# Patient Record
Sex: Female | Born: 1950 | Race: White | Hispanic: No | Smoking: Never smoker
Health system: Southern US, Community
[De-identification: ages and names within clinical notes are randomized; demographics above are authoritative.]

## PROBLEM LIST (undated history)

## (undated) DIAGNOSIS — Z9889 Other specified postprocedural states: Secondary | ICD-10-CM

## (undated) DIAGNOSIS — Z8719 Personal history of other diseases of the digestive system: Secondary | ICD-10-CM

## (undated) DIAGNOSIS — D649 Anemia, unspecified: Secondary | ICD-10-CM

## (undated) DIAGNOSIS — R112 Nausea with vomiting, unspecified: Secondary | ICD-10-CM

## (undated) HISTORY — DX: Anemia, unspecified: D64.9

## (undated) HISTORY — PX: DILATION AND CURETTAGE OF UTERUS: SHX78

## (undated) HISTORY — PX: CHOLECYSTECTOMY: SHX55

## (undated) HISTORY — PX: KNEE ARTHROSCOPY: SUR90

---

## 2013-05-15 ENCOUNTER — Encounter (INDEPENDENT_AMBULATORY_CARE_PROVIDER_SITE_OTHER): Payer: Self-pay | Admitting: Surgery

## 2013-05-25 ENCOUNTER — Encounter (INDEPENDENT_AMBULATORY_CARE_PROVIDER_SITE_OTHER): Payer: Self-pay | Admitting: Surgery

## 2013-05-25 ENCOUNTER — Ambulatory Visit (INDEPENDENT_AMBULATORY_CARE_PROVIDER_SITE_OTHER): Payer: BC Managed Care – PPO | Admitting: Surgery

## 2013-05-25 VITALS — BP 128/72 | HR 72 | Temp 99.7°F | Resp 16 | Ht 64.0 in | Wt 173.6 lb

## 2013-05-25 DIAGNOSIS — K8010 Calculus of gallbladder with chronic cholecystitis without obstruction: Secondary | ICD-10-CM

## 2013-05-25 NOTE — Progress Notes (Signed)
[name], female   DOB: Dec 14, 1950, 62 y.o.   MRN: [id number]  Chief Complaint  Patient presents with  . New Evaluation    eval sx gall stones    HPI Felicia Nguyen is a 62 y.o. female.  Referred by Dr. Beverley Fiedler for evaluation of gallbladder disease HPI This is a 62 year old female who presents after a recent emergency department visit with gallbladder disease. Earlier in the year she reports that she was on a cruise when she began having diarrhea and some mild bloating. The diarrhea lasted almost 6 weeks. She tried to self medicate with some probiotics and this seemed to help somewhat. However recently she began having more diarrhea. Recently she was on vacation and had severe epigastric and right upper quadrant pain radiating through to her back. She was brought to the emergency department in [place] where she was ruled out for cardiac event. Ultrasound reportedly showed gallstones. Her transaminases were elevated with Alkaline phosphatase 118, AST 92, ALT 44, total bilirubin 0.6, Heacox count 7.6. Recent labs from her doctor's office showed all normal liver function tests. She continues to have some mild discomfort in her right upper quadrant. He presents now to discuss surgical evaluation. She has been referred to a gastroenterologist and has an appointment in 2 days. She has never had a colonoscopy. Past Medical History  Diagnosis Date  . Anemia     Past Surgical History  Procedure Laterality Date  . Knee arthroscopy  [id number]    right knee    Family History  Problem Relation Age of Onset  . COPD Mother   . Hypertension Mother   . Cancer Father   . Cancer Sister     Social History History  Substance Use Topics  . Smoking status: Never Smoker   . Smokeless tobacco: Never Used  . Alcohol Use: Yes    No Known Allergies  Current Outpatient Prescriptions  Medication Sig Dispense Refill  . ibuprofen (ADVIL,MOTRIN) 200 MG tablet Take 200 mg  by mouth as needed for pain.      . Oxycodone-Acetaminophen (PERCOCET PO) Take by mouth.      . promethazine (PHENERGAN) 25 MG tablet Take 25 mg by mouth every 6 (six) hours as needed for nausea.       No current facility-administered medications for this visit.    Review of Systems Review of Systems  Constitutional: Negative for fever, chills and unexpected weight change.  HENT: Negative for hearing loss, congestion, sore throat, trouble swallowing and voice change.   Eyes: Negative for visual disturbance.  Respiratory: Negative for cough and wheezing.   Cardiovascular: Negative for chest pain, palpitations and leg swelling.  Gastrointestinal: Positive for nausea, vomiting, abdominal pain, diarrhea and abdominal distention. Negative for constipation, blood in stool and anal bleeding.  Genitourinary: Negative for hematuria, vaginal bleeding and difficulty urinating.  Musculoskeletal: Negative for arthralgias.  Skin: Negative for rash and wound.  Neurological: Negative for seizures, syncope and headaches.  Hematological: Negative for adenopathy. Does not bruise/bleed easily.  Psychiatric/Behavioral: Negative for confusion.    Blood pressure 128/72, pulse 72, temperature 99.7 F (37.6 C), temperature source Temporal, resp. rate 16, height 5\' 4"  (1.626 m), weight 173 lb 9.6 oz (78.744 kg).  Physical Exam Physical Exam WDWN in NAD HEENT:  EOMI, sclera anicteric Neck:  No masses, no thyromegaly Lungs:  CTA bilaterally; normal respiratory effort CV:  Regular rate and rhythm; no murmurs Abd:  +bowel sounds, mildly distended; tender in RUQ;  no masses Ext:  Well-perfused; no edema Skin:  Warm, dry; no sign of jaundice  Data Reviewed Labs from [facility] No U/S report - just a single image showing a shadowing gallstone  Assessment    Chronic calculus cholecystitis     Plan    Laparoscopic cholecystectomy with intraoperative cholangiogram. The surgical procedure has been  discussed with the patient.  Potential risks, benefits, alternative treatments, and expected outcomes have been explained.  All of the patient's questions at this time have been answered.  The likelihood of reaching the patient's treatment goal is good.  The patient understand the proposed surgical procedure and wishes to proceed.         Lyliana Dicenso K. 05/25/2013, 10:58 AM

## 2013-06-08 ENCOUNTER — Other Ambulatory Visit (INDEPENDENT_AMBULATORY_CARE_PROVIDER_SITE_OTHER): Payer: Self-pay | Admitting: Surgery

## 2013-06-08 DIAGNOSIS — K824 Cholesterolosis of gallbladder: Secondary | ICD-10-CM

## 2013-06-08 DIAGNOSIS — K8010 Calculus of gallbladder with chronic cholecystitis without obstruction: Secondary | ICD-10-CM

## 2013-06-09 ENCOUNTER — Encounter (INDEPENDENT_AMBULATORY_CARE_PROVIDER_SITE_OTHER): Payer: Self-pay

## 2013-06-19 ENCOUNTER — Encounter (INDEPENDENT_AMBULATORY_CARE_PROVIDER_SITE_OTHER): Payer: Self-pay | Admitting: Surgery

## 2013-06-19 ENCOUNTER — Ambulatory Visit (INDEPENDENT_AMBULATORY_CARE_PROVIDER_SITE_OTHER): Payer: BC Managed Care – PPO | Admitting: Surgery

## 2013-06-19 VITALS — BP 96/58 | HR 80 | Temp 99.4°F | Resp 16 | Ht 64.0 in | Wt 170.2 lb

## 2013-06-19 DIAGNOSIS — K8010 Calculus of gallbladder with chronic cholecystitis without obstruction: Secondary | ICD-10-CM

## 2013-06-19 NOTE — Progress Notes (Signed)
Status post laparoscopic cholecystectomy with intraoperative cholangiogram on 06/08/13. Initially on the cholangiogram there was no flow into the duodenum. However we administered glucagon and contrast was able to then flow into the duodenum. She may have some spasm at her sphincter of Oddi.  Currently the patient is asymptomatic. Her abdomen is soft and nontender. Appetite and bowel movements are normal. Her incisions are all well-healed no sign of infection.  She may resume flow to begin regular diet. Followup as needed.  Wilmon Arms. Corliss Skains, MD, [facility]  General/ Trauma Surgery  06/19/2013 1:00 PM

## 2013-08-18 ENCOUNTER — Encounter (INDEPENDENT_AMBULATORY_CARE_PROVIDER_SITE_OTHER): Payer: Self-pay

## 2014-06-28 ENCOUNTER — Emergency Department (HOSPITAL_COMMUNITY): Payer: BC Managed Care – PPO

## 2014-06-28 ENCOUNTER — Other Ambulatory Visit: Payer: Self-pay | Admitting: Family Medicine

## 2014-06-28 ENCOUNTER — Encounter (HOSPITAL_COMMUNITY): Payer: Self-pay | Admitting: Emergency Medicine

## 2014-06-28 ENCOUNTER — Inpatient Hospital Stay (HOSPITAL_COMMUNITY)
Admission: EM | Admit: 2014-06-28 | Discharge: 2014-07-01 | DRG: 373 | Disposition: A | Discharge status: Home / Self Care | Payer: BC Managed Care – PPO | Attending: General Surgery | Admitting: General Surgery

## 2014-06-28 ENCOUNTER — Ambulatory Visit
Admission: RE | Admit: 2014-06-28 | Discharge: 2014-06-28 | Disposition: A | Discharge status: Home / Self Care | Payer: BC Managed Care – PPO | Source: Ambulatory Visit | Attending: Family Medicine | Admitting: Family Medicine

## 2014-06-28 DIAGNOSIS — Z836 Family history of other diseases of the respiratory system: Secondary | ICD-10-CM | POA: Diagnosis not present

## 2014-06-28 DIAGNOSIS — Z9089 Acquired absence of other organs: Secondary | ICD-10-CM | POA: Diagnosis not present

## 2014-06-28 DIAGNOSIS — K352 Acute appendicitis with generalized peritonitis, without abscess: Secondary | ICD-10-CM | POA: Diagnosis not present

## 2014-06-28 DIAGNOSIS — R1084 Generalized abdominal pain: Secondary | ICD-10-CM | POA: Diagnosis not present

## 2014-06-28 DIAGNOSIS — Z8249 Family history of ischemic heart disease and other diseases of the circulatory system: Secondary | ICD-10-CM | POA: Diagnosis not present

## 2014-06-28 DIAGNOSIS — Z791 Long term (current) use of non-steroidal anti-inflammatories (NSAID): Secondary | ICD-10-CM | POA: Diagnosis not present

## 2014-06-28 DIAGNOSIS — K3532 Acute appendicitis with perforation and localized peritonitis, without abscess: Secondary | ICD-10-CM | POA: Diagnosis present

## 2014-06-28 DIAGNOSIS — R109 Unspecified abdominal pain: Secondary | ICD-10-CM

## 2014-06-28 DIAGNOSIS — K35209 Acute appendicitis with generalized peritonitis, without abscess, unspecified as to perforation: Principal | ICD-10-CM | POA: Diagnosis present

## 2014-06-28 LAB — CBC
HCT: 36.1 % (ref 36.0–46.0)
HEMOGLOBIN: 11.7 g/dL — AB (ref 12.0–15.0)
MCH: 27.3 pg (ref 26.0–34.0)
MCHC: 32.4 g/dL (ref 30.0–36.0)
MCV: 84.1 fL (ref 78.0–100.0)
PLATELETS: 293 10*3/uL (ref 150–400)
RBC: 4.29 MIL/uL (ref 3.87–5.11)
RDW: 14.5 % (ref 11.5–15.5)
WBC: 7.9 10*3/uL (ref 4.0–10.5)

## 2014-06-28 LAB — COMPREHENSIVE METABOLIC PANEL
ALT: 28 U/L (ref 0–35)
AST: 27 U/L (ref 0–37)
Albumin: 3.4 g/dL — ABNORMAL LOW (ref 3.5–5.2)
Alkaline Phosphatase: 75 U/L (ref 39–117)
Anion gap: 17 — ABNORMAL HIGH (ref 5–15)
BILIRUBIN TOTAL: 0.4 mg/dL (ref 0.3–1.2)
BUN: 15 mg/dL (ref 6–23)
CO2: 21 meq/L (ref 19–32)
Calcium: 9.3 mg/dL (ref 8.4–10.5)
Chloride: 98 mEq/L (ref 96–112)
Creatinine, Ser: 0.57 mg/dL (ref 0.50–1.10)
GLUCOSE: 109 mg/dL — AB (ref 70–99)
POTASSIUM: 4.1 meq/L (ref 3.7–5.3)
Sodium: 136 mEq/L — ABNORMAL LOW (ref 137–147)
Total Protein: 7.7 g/dL (ref 6.0–8.3)

## 2014-06-28 LAB — URINALYSIS, ROUTINE W REFLEX MICROSCOPIC
BILIRUBIN URINE: NEGATIVE
GLUCOSE, UA: NEGATIVE mg/dL
KETONES UR: 15 mg/dL — AB
Nitrite: NEGATIVE
PROTEIN: NEGATIVE mg/dL
Specific Gravity, Urine: 1.039 — ABNORMAL HIGH (ref 1.005–1.030)
Urobilinogen, UA: 0.2 mg/dL (ref 0.0–1.0)
pH: 6 (ref 5.0–8.0)

## 2014-06-28 LAB — BUN: BUN: 18 mg/dL (ref 6–23)

## 2014-06-28 LAB — URINE MICROSCOPIC-ADD ON

## 2014-06-28 LAB — CREATININE, SERUM: CREATININE: 0.7 mg/dL (ref 0.50–1.10)

## 2014-06-28 MED ORDER — DEXTROSE-NACL 5-0.9 % IV SOLN
INTRAVENOUS | Status: DC
  Administered 2014-06-28 – 2014-06-30 (×4): via INTRAVENOUS

## 2014-06-28 MED ORDER — ONDANSETRON HCL 4 MG/2ML IJ SOLN: 4.0000 mg | Freq: Four times a day (QID) | INTRAMUSCULAR | Status: DC | PRN

## 2014-06-28 MED ORDER — ACETAMINOPHEN 650 MG RE SUPP: 650.0000 mg | Freq: Four times a day (QID) | RECTAL | Status: DC | PRN

## 2014-06-28 MED ORDER — IOHEXOL 300 MG/ML  SOLN
100.0000 mL | Freq: Once | INTRAMUSCULAR | Status: AC | PRN
  Administered 2014-06-28: 100 mL via INTRAVENOUS

## 2014-06-28 MED ORDER — ENOXAPARIN SODIUM 40 MG/0.4ML ~~LOC~~ SOLN
40.0000 mg | SUBCUTANEOUS | Status: DC
  Administered 2014-06-28 – 2014-06-29 (×2): 40 mg via SUBCUTANEOUS
  Filled 2014-06-28 (×4): qty 0.4

## 2014-06-28 MED ORDER — PIPERACILLIN-TAZOBACTAM 3.375 G IVPB
3.3750 g | Freq: Three times a day (TID) | INTRAVENOUS | Status: DC
  Administered 2014-06-29: 3.375 g via INTRAVENOUS
  Filled 2014-06-28 (×4): qty 50

## 2014-06-28 MED ORDER — HYDROMORPHONE HCL PF 1 MG/ML IJ SOLN: 1.0000 mg | INTRAMUSCULAR | Status: DC | PRN

## 2014-06-28 MED ORDER — IOHEXOL 300 MG/ML  SOLN
30.0000 mL | Freq: Once | INTRAMUSCULAR | Status: AC | PRN
  Administered 2014-06-28: 30 mL via ORAL

## 2014-06-28 MED ORDER — ACETAMINOPHEN 325 MG PO TABS: 650.0000 mg | ORAL_TABLET | Freq: Four times a day (QID) | ORAL | Status: DC | PRN

## 2014-06-28 NOTE — ED Provider Notes (Signed)
CSN: [id number]     Arrival date & time 06/28/14  1547 History   First MD Initiated Contact with Patient 06/28/14 1601     Chief Complaint  Patient presents with  . Abdominal Pain     (Consider location/radiation/quality/duration/timing/severity/associated sxs/prior Treatment) The history is provided by the patient.    Patient presents to ED with outpatient CT results of perforated appendix, CT ordered by PCP.  Patient began with right lower back pain 6 days ago, developed into generalized abdominal pain with diarrhea and subjective fevers, anorexia.  She thought she had a virus, slept all weekend.  Pain actually improved yesterday but this morning she felt lightheaded, wasn't able to eat and went to her doctor to be checked.  Labs and CT ordered and patient was instructed to come to ED by [facility] and [facility] after results came in.    Last solid PO was oatmeal at 8am.  Did drink contrast for scan at 2pm.  Has abdominal hx cholecystectomy by Dr Corliss Skains, July 2014.   Past Medical History  Diagnosis Date  . Anemia    Past Surgical History  Procedure Laterality Date  . Knee arthroscopy  [id number]    right knee  . Cholecystectomy     Family History  Problem Relation Age of Onset  . COPD Mother   . Hypertension Mother   . Cancer Father   . Cancer Sister    History  Substance Use Topics  . Smoking status: Never Smoker   . Smokeless tobacco: Never Used  . Alcohol Use: Yes   OB History   Grav Para Term Preterm Abortions TAB SAB Ect Mult Living                 Review of Systems  All other systems reviewed and are negative.     Allergies  Review of patient's allergies indicates no known allergies.  Home Medications   Prior to Admission medications   Medication Sig Start Date End Date Taking? Authorizing Provider  ibuprofen (ADVIL,MOTRIN) 200 MG tablet Take 200 mg by mouth as needed for pain.    Historical Provider, MD   BP 156/79  Pulse 108  Temp(Src)  98.2 F (36.8 C) (Oral)  Resp 18  SpO2 100% Physical Exam  Nursing note and vitals reviewed. Constitutional: She appears well-developed and well-nourished. No distress.  HENT:  Head: Normocephalic and atraumatic.  Neck: Neck supple.  Cardiovascular: Normal rate and regular rhythm.   Pulmonary/Chest: Effort normal and breath sounds normal. No respiratory distress. She has no wheezes. She has no rales.  Abdominal: Soft. She exhibits no distension. There is tenderness in the right lower quadrant. There is tenderness at McBurney's point. There is no rebound and no guarding.  Neurological: She is alert.  Skin: She is not diaphoretic.    ED Course  Procedures (including critical care time) Labs Review Labs Reviewed - No data to display  Imaging Review Ct Abdomen Pelvis W Contrast  06/28/2014   CLINICAL DATA:  RIGHT lower quadrant pain for 5 days, question appendicitis, past history cholecystectomy  EXAM: CT ABDOMEN AND PELVIS WITH CONTRAST  TECHNIQUE: Multidetector CT imaging of the abdomen and pelvis was performed using the standard protocol following bolus administration of intravenous contrast. Sagittal and coronal MPR images reconstructed from axial data set.  CONTRAST:  30mL OMNIPAQUE IOHEXOL 300 MG/ML SOLN PO, OMNIPAQUE IOHEXOL 300 MG/ML SOLN IV  COMPARISON:  None  FINDINGS: Minimal subsegmental atelectasis at RIGHT lung base.  Nonspecific  8 mm RIGHT lobe liver nodule image 22.  Post cholecystectomy.  Remainder of liver, spleen, pancreas, kidneys, and adrenal glands normal.  Moderate-sized hiatal hernia.  Enlarged thickened appendix with extensive periappendiceal inflammatory changes and wall thickening of adjacent cecum consistent with acute appendicitis.  Several foci of gas adjacent to appendix compatible with localized perforation.  No discrete demarcated abscess collection.  Normal sized regional reactive lymph nodes.  No free intraperitoneal fluid or additional extraluminal/free  intraperitoneal gas identified.  Stomach and bowel loops otherwise unremarkable.  Normal appearing bladder, ureters, uterus and adnexae.  No mass or adenopathy.  Bones appear demineralized.  IMPRESSION: Perforated appendicitis.  Moderate-sized hiatal hernia.  Findings called to [place] PA on 06/28/2014 at 1528 hr.   Electronically Signed   By: Ulyses SouthwardMark  Boles M.D.   On: 06/28/2014 15:31     EKG Interpretation None        4:15 PM I spoke with general surgery who will come to see the patient.   Outpatient labs ordered hours prior to arrival.  CBC: WBC 10.8 Hgb 12.7 plt 317 UA trace blood, neg ketones, neg glucose, neg nitrites, trace leuks, protein 30. BUN creat 18, 0.7  MDM   Final diagnoses:  Acute perforated appendicitis    Pt with demonstrated perforated appendix on outpatient CT scan sent to ED by PCP.  Admitted to general surgery.     BellviewEmily Townes Fuhs, PA-C 06/29/14 [phone]

## 2014-06-28 NOTE — H&P (Signed)
Chief Complaint: acute appendicitis with perforation  HPI: Felicia Nguyen is a 63 year old female with a history of laparoscopic cholecystectomy by Dr. Corliss Skains on 06/08/13 who presents today with RLQ abdominal pain.   Duration of symptoms is 5 days.  Onset was sudden.  Coarse is worsening.  Time pattern is constant.  Location initially was right flank.  She thought she had a GI bug and spent then next few days in bed.  On Thursday she developed subjective fevers and sweats.  The pain then became generalized and by Sunday it was in the RLQ only and then the pain improved.  Modifying factors include; ibuprofen.  No aggravating or alleviating factors.  Associated factors include; subjective fevers, sweats, diarrhea, anorexia and malaise.  No melena or hematochezia.  She was seen by her PCP today and had a CT of abdomen which showed acute appendicitis with perforation and was referred to the ED.  She also had labs, however, we do not have records of this.  She is otherwise healthy and not on any blood thinners.  No cardiac history, chest pains and able to walk a flight of stairs without any dyspnea.    Past Medical History  Diagnosis Date  . Anemia     Past Surgical History  Procedure Laterality Date  . Knee arthroscopy  [id number]    right knee  . Cholecystectomy      Family History  Problem Relation Age of Onset  . COPD Mother   . Hypertension Mother   . Cancer Father   . Cancer Sister    Social History:  reports that she has never smoked. She has never used smokeless tobacco. She reports that she drinks alcohol. She reports that she does not use illicit drugs.  Allergies: No Known Allergies   (Not in a hospital admission)  No results found for this or any previous visit (from the past 48 hour(s)). Ct Abdomen Pelvis W Contrast  06/28/2014   CLINICAL DATA:  RIGHT lower quadrant pain for 5 days, question appendicitis, past history cholecystectomy  EXAM: CT ABDOMEN AND PELVIS WITH CONTRAST   TECHNIQUE: Multidetector CT imaging of the abdomen and pelvis was performed using the standard protocol following bolus administration of intravenous contrast. Sagittal and coronal MPR images reconstructed from axial data set.  CONTRAST:  30mL OMNIPAQUE IOHEXOL 300 MG/ML SOLN PO, OMNIPAQUE IOHEXOL 300 MG/ML SOLN IV  COMPARISON:  None  FINDINGS: Minimal subsegmental atelectasis at RIGHT lung base.  Nonspecific 8 mm RIGHT lobe liver nodule image 22.  Post cholecystectomy.  Remainder of liver, spleen, pancreas, kidneys, and adrenal glands normal.  Moderate-sized hiatal hernia.  Enlarged thickened appendix with extensive periappendiceal inflammatory changes and wall thickening of adjacent cecum consistent with acute appendicitis.  Several foci of gas adjacent to appendix compatible with localized perforation.  No discrete demarcated abscess collection.  Normal sized regional reactive lymph nodes.  No free intraperitoneal fluid or additional extraluminal/free intraperitoneal gas identified.  Stomach and bowel loops otherwise unremarkable.  Normal appearing bladder, ureters, uterus and adnexae.  No mass or adenopathy.  Bones appear demineralized.  IMPRESSION: Perforated appendicitis.  Moderate-sized hiatal hernia.  Findings called to [place] PA on 06/28/2014 at 1528 hr.   Electronically Signed   By: Ulyses Southward M.D.   On: 06/28/2014 15:31    Review of Systems  All other systems reviewed and are negative.   Blood pressure 138/69, pulse 103, temperature 98.7 F (37.1 C), temperature source Oral, resp. rate 27, SpO2 100.00%.  Physical Exam  Constitutional: She is oriented to person, place, and time. She appears well-developed and well-nourished. No distress.  HENT:  Head: Normocephalic and atraumatic.  Neck: Normal range of motion. Neck supple.  Cardiovascular: Normal rate, regular rhythm, normal heart sounds and intact distal pulses.  Exam reveals no gallop and no friction rub.   No murmur  heard. Respiratory: Effort normal and breath sounds normal. No respiratory distress. She has no wheezes. She has no rales. She exhibits no tenderness.  GI: Soft. Bowel sounds are normal. She exhibits no distension and no mass. There is no rebound and no guarding.  RLQ tenderness, no rebound tenderness  Musculoskeletal: Normal range of motion. She exhibits no edema and no tenderness.  Lymphadenopathy:    She has no cervical adenopathy.  Neurological: She is alert and oriented to person, place, and time.  Skin: Skin is warm and dry. She is not diaphoretic. No erythema. No pallor.  Psychiatric: She has a normal mood and affect. Her behavior is normal. Judgment and thought content normal.     Assessment/Plan Acute appendicitis with perforation: she does not appear ill at present time.  We will admit her for IV antibiotics, bowel rest and IV hydration.  If she does not have improvement over the next 24-48 hours or should she become acutely ill, then we will proceed with a appendectomy and possible ileocecectomy.  Obtain a CBC, CMP and UA.  Repeat CBC in AM and continue to trend [place] count.  Keep her NPO, pain control, anti-emetics, SCD/lovenox.    Kolston Lacount ANP-BC 06/28/2014, 4:52 PM

## 2014-06-28 NOTE — ED Provider Notes (Signed)
Date: 06/28/2014  Rate: 96  Rhythm: normal sinus rhythm  QRS Axis: normal  Intervals: normal  ST/T Wave abnormalities: normal  Conduction Disutrbances:none  Narrative Interpretation: q waves in lateral leads  Old EKG Reviewed: none available   Ethelda ChickMartha K Linker, MD 06/28/14 2222

## 2014-06-28 NOTE — ED Notes (Signed)
Pt in stating she had a CT completed today that showed a perforated appendix- pt reports abd pain over the last few days with intermittent fever, was tender to RLQ to palpation when assess by PMD this morning so she was sent for CT. Pt alert and oriented, no distress noted

## 2014-06-28 NOTE — H&P (Signed)
She is not ill appearing right now, there is phlegmon and perf appy.  I think initial conservative mgt is reasonable to see if gets better on abx. If doesn't then will consider repeat ct or surgery if no abscess present.  She will also need colonoscopy if managed nonoperatively.  I recommended interval appy to her if she resolves this.  She is agreeable to this plan

## 2014-06-29 LAB — CBC
HEMATOCRIT: 33.6 % — AB (ref 36.0–46.0)
Hemoglobin: 10.9 g/dL — ABNORMAL LOW (ref 12.0–15.0)
MCH: 26.9 pg (ref 26.0–34.0)
MCHC: 32.4 g/dL (ref 30.0–36.0)
MCV: 83 fL (ref 78.0–100.0)
Platelets: 283 10*3/uL (ref 150–400)
RBC: 4.05 MIL/uL (ref 3.87–5.11)
RDW: 14.5 % (ref 11.5–15.5)
WBC: 9.1 10*3/uL (ref 4.0–10.5)

## 2014-06-29 LAB — BASIC METABOLIC PANEL
Anion gap: 15 (ref 5–15)
BUN: 12 mg/dL (ref 6–23)
CO2: 23 meq/L (ref 19–32)
Calcium: 8.8 mg/dL (ref 8.4–10.5)
Chloride: 103 mEq/L (ref 96–112)
Creatinine, Ser: 0.62 mg/dL (ref 0.50–1.10)
GFR calc Af Amer: >90 mL/min (ref >90)
GFR calc non Af Amer: >90 mL/min (ref >90)
Glucose, Bld: 122 mg/dL — ABNORMAL HIGH (ref 70–99)
Potassium: 3.8 mEq/L (ref 3.7–5.3)
SODIUM: 141 meq/L (ref 137–147)

## 2014-06-29 MED ORDER — PIPERACILLIN-TAZOBACTAM 3.375 G IVPB
3.3750 g | Freq: Three times a day (TID) | INTRAVENOUS | Status: DC
  Administered 2014-06-29 – 2014-07-01 (×6): 3.375 g via INTRAVENOUS
  Filled 2014-06-29 (×7): qty 50

## 2014-06-29 MED ORDER — BOOST / RESOURCE BREEZE PO LIQD
1.0000 | Freq: Every day | ORAL | Status: DC
  Administered 2014-06-29: 1 via ORAL

## 2014-06-29 NOTE — ED Provider Notes (Signed)
Medical screening examination/treatment/procedure(s) were conducted as a shared visit with non-physician practitioner(s) and myself.  I personally evaluated the patient during the encounter.   EKG Interpretation None     Pt seen and examined, abdominal pain x 5 days, ruptured appendicitis on CT scan. Pt has RLQ tenderness to palpation, no rebound, nabs.  Surgery consulted upon arrival.  Last ate at 8am.    Ethelda ChickMartha K Linker, MD 06/29/14 [phone]

## 2014-06-29 NOTE — Progress Notes (Signed)
UR COMPLETED  

## 2014-06-29 NOTE — Progress Notes (Signed)
Agree with above except if doesn't improve will repeat ct to see if abscess present, no likely role for washout

## 2014-06-29 NOTE — Progress Notes (Signed)
[facility] Progress Note     Subjective: Pt feels about the same, no significant improvement yet.  No N/V.  Tolerating ice chips, having diarrhea.  Ambulating some OOB.  Urinating well.    Objective: Vital signs in last 24 hours: Temp:  [98.2 F (36.8 C)-98.9 F (37.2 C)] 98.6 F (37 C) (08/11 0532) Pulse Rate:  [74-108] 74 (08/11 0532) Resp:  [13-19] 18 (08/11 0532) BP: (113-156)/(56-86) 113/56 mmHg (08/11 0532) SpO2:  [96 %-100 %] 98 % (08/11 0532) Weight:  [177 lb (80.287 kg)] 177 lb (80.287 kg) (08/10 1821) Last BM Date: 06/27/14  Intake/Output from previous day: 08/10 0701 - 08/11 0700 In: 1410.4 [I.V.:1360.4; IV Piggyback:50] Out: 400 [Urine:400] Intake/Output this shift:    PE: Gen:  Alert, NAD, pleasant Abd: Soft, ND, tender in RLQ, +BS, no HSM   Lab Results:   Recent Labs  06/28/14 1753 06/29/14 0408  WBC 7.9 9.1  HGB 11.7* 10.9*  HCT 36.1 33.6*  PLT 293 283   BMET  Recent Labs  06/28/14 1753 06/29/14 0408  NA 136* 141  K 4.1 3.8  CL 98 103  CO2 21 23  GLUCOSE 109* 122*  BUN 15 12  CREATININE 0.57 0.62  CALCIUM 9.3 8.8   PT/INR No results found for this basename: LABPROT, INR,  in the last 72 hours CMP     Component Value Date/Time   NA 141 06/29/2014 0408   K 3.8 06/29/2014 0408   CL 103 06/29/2014 0408   CO2 23 06/29/2014 0408   GLUCOSE 122* 06/29/2014 0408   BUN 12 06/29/2014 0408   CREATININE 0.62 06/29/2014 0408   CALCIUM 8.8 06/29/2014 0408   PROT 7.7 06/28/2014 1753   ALBUMIN 3.4* 06/28/2014 1753   AST 27 06/28/2014 1753   ALT 28 06/28/2014 1753   ALKPHOS 75 06/28/2014 1753   BILITOT 0.4 06/28/2014 1753   GFRNONAA >90 06/29/2014 0408   GFRAA >90 06/29/2014 0408   Lipase  No results found for this basename: lipase       Studies/Results: Ct Abdomen Pelvis W Contrast  06/28/2014   CLINICAL DATA:  RIGHT lower quadrant pain for 5 days, question appendicitis, past history cholecystectomy  EXAM: CT ABDOMEN AND PELVIS WITH  CONTRAST  TECHNIQUE: Multidetector CT imaging of the abdomen and pelvis was performed using the standard protocol following bolus administration of intravenous contrast. Sagittal and coronal MPR images reconstructed from axial data set.  CONTRAST:  30mL OMNIPAQUE IOHEXOL 300 MG/ML SOLN PO, OMNIPAQUE IOHEXOL 300 MG/ML SOLN IV  COMPARISON:  None  FINDINGS: Minimal subsegmental atelectasis at RIGHT lung base.  Nonspecific 8 mm RIGHT lobe liver nodule image 22.  Post cholecystectomy.  Remainder of liver, spleen, pancreas, kidneys, and adrenal glands normal.  Moderate-sized hiatal hernia.  Enlarged thickened appendix with extensive periappendiceal inflammatory changes and wall thickening of adjacent cecum consistent with acute appendicitis.  Several foci of gas adjacent to appendix compatible with localized perforation.  No discrete demarcated abscess collection.  Normal sized regional reactive lymph nodes.  No free intraperitoneal fluid or additional extraluminal/free intraperitoneal gas identified.  Stomach and bowel loops otherwise unremarkable.  Normal appearing bladder, ureters, uterus and adnexae.  No mass or adenopathy.  Bones appear demineralized.  IMPRESSION: Perforated appendicitis.  Moderate-sized hiatal hernia.  Findings called to [place] PA on 06/28/2014 at 1528 hr.   Electronically Signed   By: Ulyses Southward M.D.   On: 06/28/2014 15:31   Dg Chest Portable 1 View  06/28/2014  CLINICAL DATA:  Ruptured appendix, preop.  EXAM: PORTABLE CHEST - 1 VIEW  COMPARISON:  None.  FINDINGS: Trachea is midline. Heart size is accentuated by AP semi upright technique and low lung volumes. Lungs are clear. No pleural fluid.  IMPRESSION: No acute findings.   Electronically Signed   By: Leanna BattlesMelinda  Blietz M.D.   On: 06/28/2014 17:10    Anti-infectives: Anti-infectives   Start     Dose/Rate Route Frequency Ordered Stop   06/28/14 1830  piperacillin-tazobactam (ZOSYN) IVPB 3.375 g     3.375 g 12.5 mL/hr over 240  Minutes Intravenous 3 times per day 06/28/14 1703         Assessment/Plan Acute appendicitis with perforation  Plan: 1.  IV Abx (Zosyn Day 2), clears, IVF, pain control, antiemetics 2.  Hopefully her pain will improve with conservative management, but if no improvement in the next 24-48 hours may need possible washout, appendectomy, and/or ileocecectomy 3.  Ambulate and IS 4. SCD's and lovenox    LOS: 1 day    Aris GeorgiaDORT, Javoni Lucken 06/29/2014, 7:48 AM Pager: [phone]

## 2014-06-29 NOTE — Progress Notes (Signed)
INITIAL NUTRITION ASSESSMENT  DOCUMENTATION CODES Per approved criteria  -Not Applicable   INTERVENTION: Resource Breeze (Peach flavor) po daily, each supplement provides 250 kcal and 9 grams of protein RD to follow for nutrition care plan  NUTRITION DIAGNOSIS: Unintended weight loss related to acute illness as evidenced by 5% weight loss x 1 week  Goal: Pt to meet >/= 90% of their estimated nutrition needs   Monitor:  PO & supplemental intake, weight, labs, I/O's  Reason for Assessment: Malnutrition Screening Tool Report  63 y.o. female  Admitting Dx: acute appendicitis with perforation  ASSESSMENT: 63 year old Female with a history of laparoscopic cholecystectomy who presented with RLQ abdominal pain. She was seen by her PCP and had a CT of abdomen which showed acute appendicitis with perforation and was referred to the ED.   Patient reports was eating well PTA; currently on Clear Liquid diet and tolerating; endorses a 10 lb weight loss (5%) since last Wednesday, 8/5 -- severe for time frame; would benefit from addition of oral nutrition supplement to prevent further weight loss and help meet kcal, protein needs; amenable to trying [employer] Resource Breeze; RD to order.   No muscle or subcutaneous fat depletion noticed.  Height: Ht Readings from Last 1 Encounters:  06/28/14 5\' 6"  (1.676 m)    Weight: Wt Readings from Last 1 Encounters:  06/28/14 177 lb (80.287 kg)    Ideal Body Weight: 130 lb  % Ideal Body Weight: 136%  Wt Readings from Last 10 Encounters:  06/28/14 177 lb (80.287 kg)  06/19/13 170 lb 3.2 oz (77.202 kg)  05/25/13 173 lb 9.6 oz (78.744 kg)    Usual Body Weight: 173 lb  % Usual Body Weight: 102%  BMI:  Body mass index is 28.58 kg/(m^2).  Estimated Nutritional Needs: Kcal: 2000-2200 Protein: 100-110 gm Fluid: 2.0-2.2 L  Skin: Intact  Diet Order: Clear Liquid  EDUCATION NEEDS: -No education needs identified at this  time   Intake/Output Summary (Last 24 hours) at 06/29/14 1458 Last data filed at 06/29/14 0538  Gross per 24 hour  Intake 1410.42 ml  Output    400 ml  Net 1010.42 ml   Labs:   Recent Labs Lab 06/28/14 1753 06/29/14 0408  NA 136* 141  K 4.1 3.8  CL 98 103  CO2 21 23  BUN 15 12  CREATININE 0.57 0.62  CALCIUM 9.3 8.8  GLUCOSE 109* 122*    Scheduled Meds: . enoxaparin (LOVENOX) injection  40 mg Subcutaneous Q24H  . piperacillin-tazobactam (ZOSYN)  IV  3.375 g Intravenous Q8H    Continuous Infusions: . dextrose 5 % and 0.9% NaCl 125 mL/hr at 06/29/14 1102    Past Medical History  Diagnosis Date  . Anemia     Past Surgical History  Procedure Laterality Date  . Knee arthroscopy  [id number]    right knee  . Cholecystectomy      Maureen ChattersKatie Rima Blizzard, RD, LDN Pager #: [phone] After-Hours Pager #: [phone]

## 2014-06-30 NOTE — Progress Notes (Signed)
Agree with above 

## 2014-06-30 NOTE — Progress Notes (Signed)
[facility] Progress Note     Subjective: Pt feeling much better.  No N/V, abdominal pain improved.  Slept well last night.  Ambulating well.  Having loose yellow stools and flatus.  Tolerating clears well.    Objective: Vital signs in last 24 hours: Temp:  [98 F (36.7 C)-98.3 F (36.8 C)] 98 F (36.7 C) (08/12 0630) Pulse Rate:  [69-78] 70 (08/12 0630) Resp:  [16-18] 16 (08/12 0630) BP: (115-136)/(58-69) 124/58 mmHg (08/12 0630) SpO2:  [98 %-100 %] 100 % (08/12 0630) Last BM Date: 06/29/14  Intake/Output from previous day: 08/11 0701 - 08/12 0700 In: 1000 [I.V.:1000] Out: 950 [Urine:950] Intake/Output this shift:    PE: Gen:  Alert, NAD, pleasant Abd: Soft, mild tenderness in LLQ and suprapubic, ND, +BS, no HSM   Lab Results:   Recent Labs  06/28/14 1753 06/29/14 0408  WBC 7.9 9.1  HGB 11.7* 10.9*  HCT 36.1 33.6*  PLT 293 283   BMET  Recent Labs  06/28/14 1753 06/29/14 0408  NA 136* 141  K 4.1 3.8  CL 98 103  CO2 21 23  GLUCOSE 109* 122*  BUN 15 12  CREATININE 0.57 0.62  CALCIUM 9.3 8.8   PT/INR No results found for this basename: LABPROT, INR,  in the last 72 hours CMP     Component Value Date/Time   NA 141 06/29/2014 0408   K 3.8 06/29/2014 0408   CL 103 06/29/2014 0408   CO2 23 06/29/2014 0408   GLUCOSE 122* 06/29/2014 0408   BUN 12 06/29/2014 0408   CREATININE 0.62 06/29/2014 0408   CALCIUM 8.8 06/29/2014 0408   PROT 7.7 06/28/2014 1753   ALBUMIN 3.4* 06/28/2014 1753   AST 27 06/28/2014 1753   ALT 28 06/28/2014 1753   ALKPHOS 75 06/28/2014 1753   BILITOT 0.4 06/28/2014 1753   GFRNONAA >90 06/29/2014 0408   GFRAA >90 06/29/2014 0408   Lipase  No results found for this basename: lipase       Studies/Results: Ct Abdomen Pelvis W Contrast  06/28/2014   CLINICAL DATA:  RIGHT lower quadrant pain for 5 days, question appendicitis, past history cholecystectomy  EXAM: CT ABDOMEN AND PELVIS WITH CONTRAST  TECHNIQUE: Multidetector CT  imaging of the abdomen and pelvis was performed using the standard protocol following bolus administration of intravenous contrast. Sagittal and coronal MPR images reconstructed from axial data set.  CONTRAST:  30mL OMNIPAQUE IOHEXOL 300 MG/ML SOLN PO, OMNIPAQUE IOHEXOL 300 MG/ML SOLN IV  COMPARISON:  None  FINDINGS: Minimal subsegmental atelectasis at RIGHT lung base.  Nonspecific 8 mm RIGHT lobe liver nodule image 22.  Post cholecystectomy.  Remainder of liver, spleen, pancreas, kidneys, and adrenal glands normal.  Moderate-sized hiatal hernia.  Enlarged thickened appendix with extensive periappendiceal inflammatory changes and wall thickening of adjacent cecum consistent with acute appendicitis.  Several foci of gas adjacent to appendix compatible with localized perforation.  No discrete demarcated abscess collection.  Normal sized regional reactive lymph nodes.  No free intraperitoneal fluid or additional extraluminal/free intraperitoneal gas identified.  Stomach and bowel loops otherwise unremarkable.  Normal appearing bladder, ureters, uterus and adnexae.  No mass or adenopathy.  Bones appear demineralized.  IMPRESSION: Perforated appendicitis.  Moderate-sized hiatal hernia.  Findings called to [place] PA on 06/28/2014 at 1528 hr.   Electronically Signed   By: Ulyses Southward M.D.   On: 06/28/2014 15:31   Dg Chest Portable 1 View  06/28/2014   CLINICAL DATA:  Ruptured appendix,  preop.  EXAM: PORTABLE CHEST - 1 VIEW  COMPARISON:  None.  FINDINGS: Trachea is midline. Heart size is accentuated by AP semi upright technique and low lung volumes. Lungs are clear. No pleural fluid.  IMPRESSION: No acute findings.   Electronically Signed   By: Leanna BattlesMelinda  Blietz M.D.   On: 06/28/2014 17:10    Anti-infectives: Anti-infectives   Start     Dose/Rate Route Frequency Ordered Stop   06/29/14 1200  piperacillin-tazobactam (ZOSYN) IVPB 3.375 g     3.375 g 12.5 mL/hr over 240 Minutes Intravenous Every 8 hours  06/29/14 1127     06/28/14 1830  piperacillin-tazobactam (ZOSYN) IVPB 3.375 g  Status:  Discontinued     3.375 g 12.5 mL/hr over 240 Minutes Intravenous 3 times per day 06/28/14 1703 06/29/14 1127       Assessment/Plan Acute appendicitis with perforation   Plan:  1. IV Abx (Zosyn Day 3), advance to fulls, IVF, pain control, antiemetics  2. Seems like her pain is resolving with conservative management, but if no improvement in the next 24-48 hours may need possible appendectomy, and/or ileocecectomy  3. Ambulate and IS  4. SCD's and lovenox      LOS: 2 days    DORT, Aundra MilletMEGAN 06/30/2014, 7:03 AM Pager: [phone]

## 2014-06-30 NOTE — Progress Notes (Signed)
Chaplain responded to a follow-up visit concerning an AD. Ms. Felicia Nguyen in formed me that its already been handled and taken care of. No other concerns needed to be addressed.  Gala RomneyLarry J Brown

## 2014-07-01 LAB — CBC
HEMATOCRIT: 31.2 % — AB (ref 36.0–46.0)
HEMOGLOBIN: 9.9 g/dL — AB (ref 12.0–15.0)
MCH: 26.6 pg (ref 26.0–34.0)
MCHC: 31.7 g/dL (ref 30.0–36.0)
MCV: 83.9 fL (ref 78.0–100.0)
Platelets: 290 10*3/uL (ref 150–400)
RBC: 3.72 MIL/uL — ABNORMAL LOW (ref 3.87–5.11)
RDW: 14.5 % (ref 11.5–15.5)
WBC: 8 10*3/uL (ref 4.0–10.5)

## 2014-07-01 MED ORDER — HYDROCODONE-ACETAMINOPHEN 5-325 MG PO TABS
1.0000 | ORAL_TABLET | ORAL | Status: DC | PRN
Start: 2014-07-01 — End: 2014-07-01

## 2014-07-01 MED ORDER — AMOXICILLIN-POT CLAVULANATE 875-125 MG PO TABS: 1.0000 | ORAL_TABLET | Freq: Two times a day (BID) | ORAL | Status: DC

## 2014-07-01 MED ORDER — HYDROCODONE-ACETAMINOPHEN 5-325 MG PO TABS: 1.0000 | ORAL_TABLET | Freq: Four times a day (QID) | ORAL | Status: DC | PRN

## 2014-07-01 MED ORDER — AMOXICILLIN-POT CLAVULANATE 875-125 MG PO TABS
1.0000 | ORAL_TABLET | Freq: Two times a day (BID) | ORAL | Status: DC
  Administered 2014-07-01: 1 via ORAL
  Filled 2014-07-01: qty 1

## 2014-07-01 NOTE — Discharge Summary (Signed)
  [facility] Discharge Summary   [name] MRN: [id number] DOB/AGE: [birth year] 63 y.o.  Admit date: 06/28/2014 Discharge date: 07/01/2014  Admitting Diagnosis: Acute appendicitis  Discharge Diagnosis Patient Active Problem List   Diagnosis Date Noted  . Acute appendicitis with rupture 06/28/2014  . Chronic cholecystitis with calculus 05/25/2013    Consultants None  Imaging: No results found.  Procedures None  Hospital Course:  63 year old female with a history of laparoscopic cholecystectomy by Dr. Corliss Skains on 06/08/13 who presents today with RLQ abdominal pain. Duration of symptoms is 5 days. Onset was sudden. Coarse is worsening. Time pattern is constant. Location initially was right flank. She thought she had a GI bug and spent then next few days in bed. On Thursday she developed subjective fevers and sweats. The pain then became generalized and by Sunday it was in the RLQ only and then the pain improved. Modifying factors include; ibuprofen. No aggravating or alleviating factors. Associated factors include; subjective fevers, sweats, diarrhea, anorexia and malaise. No melena or hematochezia. She was seen by her PCP today and had a CT of abdomen which showed acute appendicitis with perforation and was referred to the ED. She also had labs, however, we do not have records of this. She is otherwise healthy and not on any blood thinners. No cardiac history, chest pains and able to walk a flight of stairs without any dyspnea.   Workup showed perforated acute appendicitis without leukocytosis.  Patient was admitted and was transferred to the floor for conservative management of her appendicitis with IV antibiotics, bowel rest, IVF, and pain control.  As pain improved, diet was advanced as tolerated.  On the day of discharge WBC remained normal and she was afebrile.  On HD #3, the patient was voiding well, tolerating diet, ambulating well, pain well controlled,  vital signs stable, incisions c/d/i and felt stable for discharge home.  Patient will follow up in our office in 3 weeks and knows to call with questions or concerns.  She will continue 1 week of antibiotics (Augmentin).     Medication List         amoxicillin-clavulanate 875-125 MG per tablet  Commonly known as:  AUGMENTIN  Take 1 tablet by mouth every 12 (twelve) hours.     HYDROcodone-acetaminophen 5-325 MG per tablet  Commonly known as:  NORCO/VICODIN  Take 1-2 tablets by mouth every 6 (six) hours as needed for moderate pain or severe pain.     ibuprofen 200 MG tablet  Commonly known as:  ADVIL,MOTRIN  Take 400 mg by mouth every 8 (eight) hours as needed (pain).         Follow-up Information   Follow up with [facility], MD In 3 weeks. (For post-hospital follow up)    Specialty:  General Surgery   Contact information:   [address] [place] [address] [phone]       Signed: Candiss Norse [facility] [phone]  07/01/2014, 8:39 AM

## 2014-07-01 NOTE — Progress Notes (Signed)
Patient has tolerated a soft diet for breakfast and lunch today with no nausea or pain.  She has also tolerated her PO Abx.   Notified Dietician of the consult from Dr Dwain SarnaWakefield.  Will cont to monitor

## 2014-07-01 NOTE — Progress Notes (Signed)
Nutrition Education Note  RD consulted for nutrition education regarding A Low Fiber Diet.   RD provided "Fiber Restricted Nutrition Therapy" handout as well as "5 Sample Menus for Gradually Increasing Fiber" handout from the Academy of Nutrition and Dietetics. Reviewed patient's dietary recall and discussed ways for pt to meet nutrition goals over the next several weeks. Pt to clarify with MD how long she should continue to follow the low fiber diet. Reviewed low fiber foods and high fiber foods. Discussed that patient, when cleared by MD, should eventually transition back to a high fiber diet and discussed ways to gradually increased fiber in the diet.   Teach back method used. Pt verbalizes understanding of information provided.   Expect good compliance.  Body mass index is Body mass index is 28.58 kg/(m^2).Marland Kitchen. Pt meets criteria for Overweight based on current BMI.  Current diet order is Soft Diet, patient is consuming approximately 50% of meals at this time. Labs and medications reviewed. No further nutrition interventions warranted at this time. RD contact information provided. If additional nutrition issues arise, please re-consult RD.  Ian Malkineanne Barnett RD, LDN Inpatient Clinical Dietitian Pager: [phone] After Hours Pager: [phone]

## 2014-07-01 NOTE — Progress Notes (Signed)
Patient and husband given discharge instructions, prescriptions, and follow-up information.  They verbalized understanding of all instructions.  No concerns or questions at this time.  Pt ready for discharge home, taken down via wheelchair to go home with husband

## 2014-07-01 NOTE — Discharge Instructions (Addendum)
If you develop fevers, worse abdominal pain, unable to pass gas or bowel movement call or return to er.  May need pro-biotic pills like (Align or generic) to help prevent diarrhea during use of antibiotic.  Appendicitis Appendicitis is when the appendix is swollen (inflamed). The inflammation can lead to developing a hole (perforation) and a collection of pus (abscess). CAUSES  There is not always an obvious cause of appendicitis. Sometimes it is caused by an obstruction in the appendix. The obstruction can be caused by:  A small, hard, pea-sized ball of stool (fecalith).  Enlarged lymph glands in the appendix. SYMPTOMS   Pain around your belly button (navel) that moves toward your lower right belly (abdomen). The pain can become more severe and sharp as time passes.  Tenderness in the lower right abdomen. Pain gets worse if you cough or make a sudden movement.  Feeling sick to your stomach (nauseous).  Throwing up (vomiting).  Loss of appetite.  Fever.  Constipation.  Diarrhea.  Generally not feeling well. DIAGNOSIS   Physical exam.  Blood tests.  Urine test.  X-rays or a CT scan may confirm the diagnosis. TREATMENT  Once the diagnosis of appendicitis is made, the most common treatment is to remove the appendix as soon as possible. This procedure is called appendectomy. In an open appendectomy, a cut (incision) is made in the lower right abdomen and the appendix is removed. In a laparoscopic appendectomy, usually 3 small incisions are made. Long, thin instruments and a camera tube are used to remove the appendix. Most patients go home in 24 to 48 hours after appendectomy. In some situations, the appendix may have already perforated and an abscess may have formed. The abscess may have a "wall" around it as seen on a CT scan. In this case, a drain may be placed into the abscess to remove fluid, and you may be treated with antibiotic medicines that kill germs. The medicine is  given through a tube in your vein (IV). Once the abscess has resolved, it may or may not be necessary to have an appendectomy. You may need to stay in the hospital longer than 48 hours. Document Released: 11/05/2005 Document Revised: 05/06/2012 Document Reviewed: 01/31/2010 [facility] Patient Information 2015 [place] [place]. This information is not intended to replace advice given to you by your health care provider. Make sure you discuss any questions you have with your health care provider.   Laparoscopic Appendectomy Appendectomy is surgery to remove the appendix. Laparoscopic surgery uses several small cuts (incisions) instead of one large incision. Laparoscopic surgery offers a shorter recovery time and less discomfort. LET YOUR CAREGIVER KNOW ABOUT:  Allergies to food or medicine.  Medicines taken, including vitamins, dietary supplements, herbs, eyedrops, over-the-counter medicines, and creams.  Use of steroids (by mouth or creams).  Previous problems with anesthetics or numbing medicines.  History of bleeding problems or blood clots.  Previous surgery.  Other health problems, including diabetes, heart problems, lung problems, and kidney problems.  Possibility of pregnancy, if this applies. RISKS AND COMPLICATIONS  Infection. A germ starts growing in the wound. This can usually be treated with antibiotics. In some cases, the wound will need to be opened and cleaned.  Bleeding.  Damage to other organs.  Sores (abscesses).  Chronic pain at the incision sites. This is defined as pain that lasts for more than 3 months.  Blood clots in the legs that may rarely travel to the lungs.  Infection in the lungs (pneumonia). BEFORE THE  PROCEDURE Appendectomy is usually performed immediately after an inflamed appendix (appendicitis) is diagnosed. No preparation is necessary ahead of this procedure. PROCEDURE  You will be given medicine that makes you sleep (general anesthetic). After  you are asleep, a flexible tube (catheter) may be inserted into your bladder to drain your urine during surgery. The tube is removed before you wake up after surgery. When you are asleep, carbondioxide gas will be used to inflate your abdomen. This will allow your surgeon to see inside your abdomen and perform your surgery. Three small incisions will be made in your abdomen. Your surgeon will insert a thin, lighted tube (laparoscope) through one of the incisions. Your surgeon will look through the laparoscope while performing the surgery. Other tools will be inserted through the other incisions. Laparoscopic procedures may not be appropriate when:  There is major scarring from a previous surgery.  The patient has bleeding disorders.  A pregnancy is near term.  There are other conditions which make the laparoscopic procedure impossible, such as an advanced infection or a ruptured appendix. If your surgeon feels it is not safe to continue with the laparoscopic procedure, he or she will perform an open surgery instead. This gives the surgeon a larger view and more space to work. Open surgery requires a longer recovery time. After your appendix is removed, your incisions will be closed with stitches (sutures) or skin adhesive. AFTER THE PROCEDURE You will be taken to a recovery room. When the anesthesia has worn off, you will be returned to your hospital room. You will be given pain medicines to keep you comfortable. Ask your caregiver how long your hospital stay will be. Document Released: 06/19/2004 Document Revised: 01/28/2012 Document Reviewed: 05/15/2011 [facility] Patient Information 2015 [place], [place]. This information is not intended to replace advice given to you by your health care provider. Make sure you discuss any questions you have with your health care provider.

## 2014-07-01 NOTE — Progress Notes (Signed)
  Subjective: Having some bms, passing flatus, tol liquids, pain much better  Objective: Vital signs in last 24 hours: Temp:  [97.9 F (36.6 C)-98.5 F (36.9 C)] 97.9 F (36.6 C) (08/13 0618) Pulse Rate:  [70-86] 71 (08/13 0618) Resp:  [16-17] 16 (08/13 0618) BP: (100-118)/(54-86) 100/54 mmHg (08/13 0618) SpO2:  [99 %-100 %] 99 % (08/13 0618) Last BM Date: 06/29/14  Intake/Output from previous day: 08/12 0701 - 08/13 0700 In: 1634 [P.O.:240; I.V.:1394] Out: 1150 [Urine:1150] Intake/Output this shift:    General appearance: no distress GI: soft mild tenderness in rlq bs present  Lab Results:   Recent Labs  06/28/14 1753 06/29/14 0408  WBC 7.9 9.1  HGB 11.7* 10.9*  HCT 36.1 33.6*  PLT 293 283   BMET  Recent Labs  06/28/14 1753 06/29/14 0408  NA 136* 141  K 4.1 3.8  CL 98 103  CO2 21 23  GLUCOSE 109* 122*  BUN 15 12  CREATININE 0.57 0.62  CALCIUM 9.3 8.8    Anti-infectives: Anti-infectives   Start     Dose/Rate Route Frequency Ordered Stop   07/01/14 1000  amoxicillin-clavulanate (AUGMENTIN) 875-125 MG per tablet 1 tablet     1 tablet Oral Every 12 hours 07/01/14 0745     07/01/14 0000  amoxicillin-clavulanate (AUGMENTIN) 875-125 MG per tablet     1 tablet Oral 2 times daily 07/01/14 0738     06/29/14 1200  piperacillin-tazobactam (ZOSYN) IVPB 3.375 g  Status:  Discontinued     3.375 g 12.5 mL/hr over 240 Minutes Intravenous Every 8 hours 06/29/14 1127 07/01/14 0745   06/28/14 1830  piperacillin-tazobactam (ZOSYN) IVPB 3.375 g  Status:  Discontinued     3.375 g 12.5 mL/hr over 240 Minutes Intravenous 3 times per day 06/28/14 1703 06/29/14 1127      Assessment/Plan: Acute appendicitis with perforation  Plan:  1. She is improving today, will switch to oral abx and dc home on low fiber diet if does well. I think she will get better and be able to undergo interval appy.  We discussed colonoscopy prior to this if able. Will see back in the office in 2  weeks.  [facility]WAKEFIELD,Maury Groninger 07/01/2014

## 2014-07-09 ENCOUNTER — Telehealth (INDEPENDENT_AMBULATORY_CARE_PROVIDER_SITE_OTHER): Payer: Self-pay | Admitting: *Deleted

## 2014-07-09 ENCOUNTER — Telehealth (INDEPENDENT_AMBULATORY_CARE_PROVIDER_SITE_OTHER): Payer: Self-pay

## 2014-07-09 DIAGNOSIS — K3532 Acute appendicitis with perforation and localized peritonitis, without abscess: Secondary | ICD-10-CM

## 2014-07-09 MED ORDER — AMOXICILLIN-POT CLAVULANATE 875-125 MG PO TABS: 1.0000 | ORAL_TABLET | Freq: Two times a day (BID) | ORAL | Status: DC

## 2014-07-09 NOTE — Telephone Encounter (Signed)
Called pt back after checking with Dr Dwain SarnaWakefield on where to put pt for an appt. I made her appt for 8/27 at 4:20/4:30. I also refilled the Augmentin 875mg  e [facility] to [facility] [place] [place] [facility] [place] [facility] per pt's request. Pt understands.

## 2014-07-09 NOTE — Telephone Encounter (Signed)
Received a call from Mr. Hawkins regarding the pt.  He was upset that it has been 2 weeks and after several messages, the pt still hasn't received a call back regarding a f/u appt with Dr. Dwain SarnaWakefield.  [facility], Dr. Kerry DoryWakefields assistant, and because Dr. Dwain SarnaWakefield has been on vacation, she could not double book the pt without him letting her know where to put her.  I explained this to Mr. Juanetta GoslingHawkins and assured him that Elease Hashimotolisha was very good about following up with her pt's and that she will follow up with Dr. Dwain SarnaWakefield and contact the pt.  He also mentioned that he was concerned about the pt not having anymore antibiotics and it was their understanding that she was supposed to continue on them until her f/u appt.  Elease Hashimotolisha is going to contact one of our PA's that rounded the pt in the hospital to see about a refill of antibiotic or to see if pt needs to continue and she will follow up with the pt.  Mr. Juanetta GoslingHawkins verbalized understanding and appreciation.  Victorino DikeJennifer

## 2014-07-09 NOTE — Telephone Encounter (Signed)
Message copied by Ethlyn GallerySPILLERS, Janavia Rottman on Fri Jul 09, 2014 12:10 PM ------      Message from: Leanne ChangGALLOWAY, WENDY K      Created: Tue Jul 06, 2014 11:52 AM      Regarding: Dr Jeananne RamaWakefield      Contact: [phone]       Patient in hospital on 06/28/14 and says Dr Dwain SarnaWakefield wanted her to come in office around 07/12/14 for check up. ------

## 2014-07-15 ENCOUNTER — Ambulatory Visit (INDEPENDENT_AMBULATORY_CARE_PROVIDER_SITE_OTHER): Payer: BC Managed Care – PPO | Admitting: General Surgery

## 2014-07-15 ENCOUNTER — Encounter (INDEPENDENT_AMBULATORY_CARE_PROVIDER_SITE_OTHER): Payer: Self-pay | Admitting: General Surgery

## 2014-07-15 VITALS — BP 118/80 | HR 66 | Temp 97.2°F | Ht 66.0 in | Wt 181.0 lb

## 2014-07-15 DIAGNOSIS — K35209 Acute appendicitis with generalized peritonitis, without abscess, unspecified as to perforation: Secondary | ICD-10-CM

## 2014-07-15 DIAGNOSIS — K3532 Acute appendicitis with perforation and localized peritonitis, without abscess: Secondary | ICD-10-CM

## 2014-07-15 DIAGNOSIS — K352 Acute appendicitis with generalized peritonitis, without abscess: Secondary | ICD-10-CM

## 2014-07-15 NOTE — Progress Notes (Signed)
Subjective:     [name], female   DOB: January 02, 1951, 63 y.o.   MRN: [id number]  HPI 63 yof admitted to hospital from 8/10-8/13 with perforated appendicitis. She was not ill at that time and was treated with abx. She rapidly improved and was discharged home on abx.  She is almost done with her abx. She is eating well.  Having normal bms.  She is back to work.  She gets tired at end of day.  She has some occasional abdominal soreness.  No fevers.  She returns today to discuss options.  Review of Systems     Objective:   Physical Exam Abdomen with some mild discomfort with deep palpation in bilateral lower quadrants    Assessment:     Perforated appendicitis treated conservatively    Plan:     I think she is doing well and will stop abx when these are completed.  I also think we can continue conservative mgt.  I will refer her to see GI for evaluation for colonoscopy if able prior to consideration of interval appy which I recommended to her. She understands to call if she develops any symptoms which would prompt possible earlier surgery or reevaluation with imagine.I will see her back after gi evaluation to schedule.

## 2014-07-20 ENCOUNTER — Telehealth (INDEPENDENT_AMBULATORY_CARE_PROVIDER_SITE_OTHER): Payer: Self-pay

## 2014-07-20 NOTE — Telephone Encounter (Signed)
Pt aware of appt with Dr Ewing Schlein for 07/21/14 at 4:30. I will fax records to their office.

## 2014-07-20 NOTE — Telephone Encounter (Signed)
LMOM for pt to call me back. I have made her an appt with [facility] with Dr Ewing Schlein for 9/2 arrive at 4:15/4:30. I need to give her some information.

## 2014-07-23 ENCOUNTER — Other Ambulatory Visit (INDEPENDENT_AMBULATORY_CARE_PROVIDER_SITE_OTHER): Payer: Self-pay | Admitting: General Surgery

## 2014-08-12 ENCOUNTER — Other Ambulatory Visit: Payer: Self-pay | Admitting: Gastroenterology

## 2014-08-24 ENCOUNTER — Encounter (HOSPITAL_COMMUNITY): Payer: Self-pay | Admitting: Pharmacy Technician

## 2014-08-26 ENCOUNTER — Encounter (HOSPITAL_COMMUNITY)
Admission: RE | Admit: 2014-08-26 | Discharge: 2014-08-26 | Disposition: A | Discharge status: Home / Self Care | Payer: BC Managed Care – PPO | Source: Ambulatory Visit | Attending: General Surgery | Admitting: General Surgery

## 2014-08-26 ENCOUNTER — Encounter (HOSPITAL_COMMUNITY): Payer: Self-pay

## 2014-08-26 DIAGNOSIS — K8010 Calculus of gallbladder with chronic cholecystitis without obstruction: Secondary | ICD-10-CM | POA: Insufficient documentation

## 2014-08-26 DIAGNOSIS — D649 Anemia, unspecified: Secondary | ICD-10-CM | POA: Insufficient documentation

## 2014-08-26 DIAGNOSIS — Z0000 Encounter for general adult medical examination without abnormal findings: Secondary | ICD-10-CM | POA: Diagnosis present

## 2014-08-26 DIAGNOSIS — K352 Acute appendicitis with generalized peritonitis: Secondary | ICD-10-CM | POA: Diagnosis not present

## 2014-08-26 HISTORY — DX: Personal history of other diseases of the digestive system: Z87.19

## 2014-08-26 HISTORY — DX: Nausea with vomiting, unspecified: R11.2

## 2014-08-26 HISTORY — DX: Other specified postprocedural states: Z98.890

## 2014-08-26 LAB — CBC WITH DIFFERENTIAL/PLATELET
Basophils Absolute: 0 10*3/uL (ref 0.0–0.1)
Basophils Relative: 0 % (ref 0–1)
EOS ABS: 0.1 10*3/uL (ref 0.0–0.7)
Eosinophils Relative: 2 % (ref 0–5)
HCT: 37 % (ref 36.0–46.0)
HEMOGLOBIN: 12 g/dL (ref 12.0–15.0)
LYMPHS ABS: 2.5 10*3/uL (ref 0.7–4.0)
Lymphocytes Relative: 37 % (ref 12–46)
MCH: 26.7 pg (ref 26.0–34.0)
MCHC: 32.4 g/dL (ref 30.0–36.0)
MCV: 82.4 fL (ref 78.0–100.0)
Monocytes Absolute: 0.5 10*3/uL (ref 0.1–1.0)
Monocytes Relative: 7 % (ref 3–12)
NEUTROS PCT: 54 % (ref 43–77)
Neutro Abs: 3.5 10*3/uL (ref 1.7–7.7)
Platelets: 245 10*3/uL (ref 150–400)
RBC: 4.49 MIL/uL (ref 3.87–5.11)
RDW: 15.1 % (ref 11.5–15.5)
WBC: 6.6 10*3/uL (ref 4.0–10.5)

## 2014-08-26 LAB — BASIC METABOLIC PANEL
Anion gap: 11 (ref 5–15)
BUN: 15 mg/dL (ref 6–23)
CO2: 23 mEq/L (ref 19–32)
Calcium: 8.6 mg/dL (ref 8.4–10.5)
Chloride: 107 mEq/L (ref 96–112)
Creatinine, Ser: 0.56 mg/dL (ref 0.50–1.10)
GFR calc non Af Amer: >90 mL/min (ref >90)
GLUCOSE: 100 mg/dL — AB (ref 70–99)
POTASSIUM: 3.5 meq/L — AB (ref 3.7–5.3)
Sodium: 141 mEq/L (ref 137–147)

## 2014-08-26 NOTE — Pre-Procedure Instructions (Signed)
Ronnell GuadalajaraJudith G Wiesman  08/26/2014   Your procedure is scheduled on:  Thursday, October 15th   [facility]  7:00 AM.   Call this number if you have problems the morning of surgery: [phone]   Remember:   Do not eat food or drink liquids after midnight Wednesday.   Take these medicines the morning of surgery with A SIP OF WATER:    Do not wear jewelry, make-up or nail polish.  Do not wear lotions, powders, or perfumes. You may NOT wear deodorant.  Do not shave 48 hours prior to surgery.   Do not bring valuables to the hospital.  [facility] responsible for any belongings or valuables.               Contacts, dentures or bridgework may not be worn into surgery.  Leave suitcase in the car. After surgery it may be brought to your room.  For patients admitted to the hospital, discharge time is determined by your treatment team.               Name and phone number of your driver:    Special Instructions: "Preparing for Surgery" instruction sheet.   Please read over the following fact sheets that you were given: Pain Booklet and Surgical Site Infection Prevention

## 2014-09-01 MED ORDER — CEFAZOLIN SODIUM-DEXTROSE 2-3 GM-% IV SOLR
2.0000 g | INTRAVENOUS | Status: AC
  Administered 2014-09-02: 2 g via INTRAVENOUS
  Filled 2014-09-01: qty 50

## 2014-09-02 ENCOUNTER — Encounter (HOSPITAL_COMMUNITY): Payer: Self-pay | Admitting: *Deleted

## 2014-09-02 ENCOUNTER — Encounter (HOSPITAL_COMMUNITY)
Admission: RE | Disposition: A | Discharge status: Home / Self Care | Payer: Self-pay | Source: Ambulatory Visit | Attending: General Surgery

## 2014-09-02 ENCOUNTER — Ambulatory Visit (HOSPITAL_COMMUNITY): Payer: BC Managed Care – PPO | Admitting: Anesthesiology

## 2014-09-02 ENCOUNTER — Encounter (HOSPITAL_COMMUNITY): Payer: BC Managed Care – PPO | Admitting: Anesthesiology

## 2014-09-02 ENCOUNTER — Inpatient Hospital Stay (HOSPITAL_COMMUNITY)
Admission: RE | Admit: 2014-09-02 | Discharge: 2014-09-03 | DRG: 340 | Disposition: A | Discharge status: Home / Self Care | Payer: BC Managed Care – PPO | Source: Ambulatory Visit | Attending: General Surgery | Admitting: General Surgery

## 2014-09-02 DIAGNOSIS — K3533 Acute appendicitis with perforation and localized peritonitis, with abscess: Secondary | ICD-10-CM | POA: Diagnosis present

## 2014-09-02 DIAGNOSIS — K352 Acute appendicitis with generalized peritonitis: Principal | ICD-10-CM | POA: Diagnosis present

## 2014-09-02 DIAGNOSIS — K219 Gastro-esophageal reflux disease without esophagitis: Secondary | ICD-10-CM | POA: Diagnosis present

## 2014-09-02 DIAGNOSIS — R1031 Right lower quadrant pain: Secondary | ICD-10-CM | POA: Diagnosis present

## 2014-09-02 HISTORY — PX: APPENDECTOMY: SHX54

## 2014-09-02 HISTORY — PX: LAPAROSCOPIC APPENDECTOMY: SHX408

## 2014-09-02 SURGERY — APPENDECTOMY, LAPAROSCOPIC
Anesthesia: General | Site: Abdomen

## 2014-09-02 MED ORDER — DEXAMETHASONE SODIUM PHOSPHATE 4 MG/ML IJ SOLN
INTRAMUSCULAR | Status: DC | PRN
  Administered 2014-09-02: 8 mg via INTRAVENOUS

## 2014-09-02 MED ORDER — BUPIVACAINE-EPINEPHRINE (PF) 0.25% -1:200000 IJ SOLN
INTRAMUSCULAR | Status: AC
  Filled 2014-09-02: qty 30

## 2014-09-02 MED ORDER — SCOPOLAMINE 1 MG/3DAYS TD PT72
MEDICATED_PATCH | TRANSDERMAL | Status: DC | PRN
  Administered 2014-09-02: 1 via TRANSDERMAL

## 2014-09-02 MED ORDER — NEOSTIGMINE METHYLSULFATE 10 MG/10ML IV SOLN
INTRAVENOUS | Status: DC | PRN
  Administered 2014-09-02: 4 mg via INTRAVENOUS

## 2014-09-02 MED ORDER — LACTATED RINGERS IV SOLN
INTRAVENOUS | Status: DC | PRN
  Administered 2014-09-02 (×2): via INTRAVENOUS

## 2014-09-02 MED ORDER — LACTATED RINGERS IV SOLN
INTRAVENOUS | Status: DC
  Administered 2014-09-02: 08:00:00 via INTRAVENOUS

## 2014-09-02 MED ORDER — SODIUM CHLORIDE 0.9 % IV SOLN
INTRAVENOUS | Status: DC
  Administered 2014-09-02: 17:00:00 via INTRAVENOUS

## 2014-09-02 MED ORDER — HYDROMORPHONE HCL 1 MG/ML IJ SOLN
INTRAMUSCULAR | Status: AC
  Filled 2014-09-02: qty 1

## 2014-09-02 MED ORDER — HYDROMORPHONE HCL 1 MG/ML IJ SOLN
INTRAMUSCULAR | Status: AC
Start: 2014-09-02 — End: 2014-09-02
  Filled 2014-09-02: qty 1

## 2014-09-02 MED ORDER — PROPOFOL 10 MG/ML IV BOLUS
INTRAVENOUS | Status: AC
  Filled 2014-09-02: qty 20

## 2014-09-02 MED ORDER — ACETAMINOPHEN 325 MG PO TABS: 650.0000 mg | ORAL_TABLET | Freq: Four times a day (QID) | ORAL | Status: DC | PRN

## 2014-09-02 MED ORDER — ROCURONIUM BROMIDE 100 MG/10ML IV SOLN
INTRAVENOUS | Status: DC | PRN
  Administered 2014-09-02: 40 mg via INTRAVENOUS
  Administered 2014-09-02: 10 mg via INTRAVENOUS

## 2014-09-02 MED ORDER — ONDANSETRON HCL 4 MG/2ML IJ SOLN: 4.0000 mg | Freq: Four times a day (QID) | INTRAMUSCULAR | Status: DC | PRN

## 2014-09-02 MED ORDER — 0.9 % SODIUM CHLORIDE (POUR BTL) OPTIME
TOPICAL | Status: DC | PRN
  Administered 2014-09-02: 1000 mL

## 2014-09-02 MED ORDER — OXYCODONE HCL 5 MG PO TABS
5.0000 mg | ORAL_TABLET | Freq: Once | ORAL | Status: AC | PRN
  Administered 2014-09-02: 5 mg via ORAL

## 2014-09-02 MED ORDER — ONDANSETRON HCL 4 MG/2ML IJ SOLN
4.0000 mg | Freq: Four times a day (QID) | INTRAMUSCULAR | Status: DC | PRN
Start: 2014-09-02 — End: 2014-09-03
  Administered 2014-09-03: 4 mg via INTRAVENOUS
  Filled 2014-09-02: qty 2

## 2014-09-02 MED ORDER — ACETAMINOPHEN 650 MG RE SUPP: 650.0000 mg | Freq: Four times a day (QID) | RECTAL | Status: DC | PRN

## 2014-09-02 MED ORDER — PROPOFOL 10 MG/ML IV BOLUS
INTRAVENOUS | Status: DC | PRN
  Administered 2014-09-02: 200 mg via INTRAVENOUS

## 2014-09-02 MED ORDER — SODIUM CHLORIDE 0.9 % IR SOLN
Status: DC | PRN
  Administered 2014-09-02: 1

## 2014-09-02 MED ORDER — GLYCOPYRROLATE 0.2 MG/ML IJ SOLN
INTRAMUSCULAR | Status: AC
  Filled 2014-09-02: qty 3

## 2014-09-02 MED ORDER — SCOPOLAMINE 1 MG/3DAYS TD PT72
MEDICATED_PATCH | TRANSDERMAL | Status: AC
  Filled 2014-09-02: qty 1

## 2014-09-02 MED ORDER — LIDOCAINE HCL (CARDIAC) 20 MG/ML IV SOLN
INTRAVENOUS | Status: DC | PRN
  Administered 2014-09-02: 70 mg via INTRAVENOUS

## 2014-09-02 MED ORDER — HYDROMORPHONE HCL 1 MG/ML IJ SOLN
0.2500 mg | INTRAMUSCULAR | Status: DC | PRN
  Administered 2014-09-02 (×6): 0.5 mg via INTRAVENOUS

## 2014-09-02 MED ORDER — OXYCODONE HCL 5 MG PO TABS
5.0000 mg | ORAL_TABLET | ORAL | Status: DC | PRN
  Administered 2014-09-02 – 2014-09-03 (×5): 5 mg via ORAL
  Filled 2014-09-02 (×5): qty 1

## 2014-09-02 MED ORDER — FENTANYL CITRATE 0.05 MG/ML IJ SOLN
INTRAMUSCULAR | Status: DC | PRN
  Administered 2014-09-02: 50 ug via INTRAVENOUS
  Administered 2014-09-02: 100 ug via INTRAVENOUS
  Administered 2014-09-02 (×2): 50 ug via INTRAVENOUS
  Administered 2014-09-02: 100 ug via INTRAVENOUS

## 2014-09-02 MED ORDER — OXYCODONE HCL 5 MG PO TABS
ORAL_TABLET | ORAL | Status: AC
  Filled 2014-09-02: qty 1

## 2014-09-02 MED ORDER — FENTANYL CITRATE 0.05 MG/ML IJ SOLN
INTRAMUSCULAR | Status: AC
  Filled 2014-09-02: qty 5

## 2014-09-02 MED ORDER — GLYCOPYRROLATE 0.2 MG/ML IJ SOLN
INTRAMUSCULAR | Status: DC | PRN
  Administered 2014-09-02: 0.6 mg via INTRAVENOUS

## 2014-09-02 MED ORDER — MIDAZOLAM HCL 5 MG/5ML IJ SOLN
INTRAMUSCULAR | Status: DC | PRN
  Administered 2014-09-02: 2 mg via INTRAVENOUS

## 2014-09-02 MED ORDER — NEOSTIGMINE METHYLSULFATE 10 MG/10ML IV SOLN
INTRAVENOUS | Status: AC
  Filled 2014-09-02: qty 1

## 2014-09-02 MED ORDER — OXYCODONE HCL 5 MG/5ML PO SOLN: 5.0000 mg | Freq: Once | ORAL | Status: AC | PRN

## 2014-09-02 MED ORDER — BUPIVACAINE-EPINEPHRINE 0.25% -1:200000 IJ SOLN
INTRAMUSCULAR | Status: DC | PRN
  Administered 2014-09-02: 9 mL

## 2014-09-02 MED ORDER — ENOXAPARIN SODIUM 40 MG/0.4ML ~~LOC~~ SOLN
40.0000 mg | SUBCUTANEOUS | Status: DC
  Administered 2014-09-03: 40 mg via SUBCUTANEOUS
  Filled 2014-09-02: qty 0.4

## 2014-09-02 MED ORDER — ONDANSETRON HCL 4 MG/2ML IJ SOLN
INTRAMUSCULAR | Status: DC | PRN
  Administered 2014-09-02: 4 mg via INTRAVENOUS

## 2014-09-02 MED ORDER — DEXAMETHASONE SODIUM PHOSPHATE 4 MG/ML IJ SOLN
INTRAMUSCULAR | Status: AC
  Filled 2014-09-02: qty 2

## 2014-09-02 MED ORDER — MIDAZOLAM HCL 2 MG/2ML IJ SOLN
INTRAMUSCULAR | Status: AC
  Filled 2014-09-02: qty 2

## 2014-09-02 MED ORDER — ARTIFICIAL TEARS OP OINT
TOPICAL_OINTMENT | OPHTHALMIC | Status: DC | PRN
  Administered 2014-09-02: 1 via OPHTHALMIC

## 2014-09-02 MED ORDER — ONDANSETRON HCL 4 MG/2ML IJ SOLN
INTRAMUSCULAR | Status: AC
  Filled 2014-09-02: qty 2

## 2014-09-02 MED ORDER — MORPHINE SULFATE 2 MG/ML IJ SOLN
2.0000 mg | INTRAMUSCULAR | Status: DC | PRN
  Administered 2014-09-03: 2 mg via INTRAVENOUS
  Filled 2014-09-02: qty 1

## 2014-09-02 SURGICAL SUPPLY — 41 items
APPLIER CLIP ROT 10 11.4 M/L (STAPLE)
CANISTER SUCTION 2500CC (MISCELLANEOUS) ×3 IMPLANT
CHLORAPREP W/TINT 26ML (MISCELLANEOUS) ×3 IMPLANT
CLIP APPLIE ROT 10 11.4 M/L (STAPLE) IMPLANT
CLOSURE WOUND 1/2 X4 (GAUZE/BANDAGES/DRESSINGS) ×1
COVER SURGICAL LIGHT HANDLE (MISCELLANEOUS) ×3 IMPLANT
CUTTER FLEX LINEAR 45M (STAPLE) ×3 IMPLANT
DERMABOND ADVANCED (GAUZE/BANDAGES/DRESSINGS) ×2
DERMABOND ADVANCED .7 DNX12 (GAUZE/BANDAGES/DRESSINGS) ×1 IMPLANT
DEVICE TROCAR PUNCTURE CLOSURE (ENDOMECHANICALS) ×3 IMPLANT
DRAPE LAPAROSCOPIC ABDOMINAL (DRAPES) ×3 IMPLANT
ELECT REM PT RETURN 9FT ADLT (ELECTROSURGICAL) ×3
ELECTRODE REM PT RTRN 9FT ADLT (ELECTROSURGICAL) ×1 IMPLANT
GLOVE BIO SURGEON STRL SZ7 (GLOVE) ×3 IMPLANT
GLOVE BIOGEL PI IND STRL 7.5 (GLOVE) ×1 IMPLANT
GLOVE BIOGEL PI INDICATOR 7.5 (GLOVE) ×2
GOWN STRL REUS W/ TWL LRG LVL3 (GOWN DISPOSABLE) ×3 IMPLANT
GOWN STRL REUS W/TWL LRG LVL3 (GOWN DISPOSABLE) ×6
KIT BASIN OR (CUSTOM PROCEDURE TRAY) ×3 IMPLANT
KIT ROOM TURNOVER OR (KITS) ×3 IMPLANT
NS IRRIG 1000ML POUR BTL (IV SOLUTION) ×3 IMPLANT
PAD ARMBOARD 7.5X6 YLW CONV (MISCELLANEOUS) ×6 IMPLANT
POUCH RETRIEVAL ECOSAC 10 (ENDOMECHANICALS) ×1 IMPLANT
POUCH RETRIEVAL ECOSAC 10MM (ENDOMECHANICALS) ×2
RELOAD 45 VASCULAR/THIN (ENDOMECHANICALS) ×3 IMPLANT
RELOAD STAPLE TA45 3.5 REG BLU (ENDOMECHANICALS) IMPLANT
SCALPEL HARMONIC ACE (MISCELLANEOUS) ×3 IMPLANT
SCISSORS LAP 5X35 DISP (ENDOMECHANICALS) IMPLANT
SET IRRIG TUBING LAPAROSCOPIC (IRRIGATION / IRRIGATOR) ×3 IMPLANT
SLEEVE ENDOPATH XCEL 5M (ENDOMECHANICALS) ×3 IMPLANT
SPECIMEN JAR SMALL (MISCELLANEOUS) ×3 IMPLANT
STRIP CLOSURE SKIN 1/2X4 (GAUZE/BANDAGES/DRESSINGS) ×2 IMPLANT
SUT MNCRL AB 4-0 PS2 18 (SUTURE) ×3 IMPLANT
SUT VICRYL 0 UR6 27IN ABS (SUTURE) ×3 IMPLANT
TOWEL OR 17X24 6PK STRL BLUE (TOWEL DISPOSABLE) ×3 IMPLANT
TOWEL OR 17X26 10 PK STRL BLUE (TOWEL DISPOSABLE) ×3 IMPLANT
TRAY FOLEY CATH 16FR SILVER (SET/KITS/TRAYS/PACK) ×3 IMPLANT
TRAY LAPAROSCOPIC (CUSTOM PROCEDURE TRAY) ×3 IMPLANT
TROCAR XCEL BLUNT TIP 100MML (ENDOMECHANICALS) ×3 IMPLANT
TROCAR XCEL NON-BLD 5MMX100MML (ENDOMECHANICALS) ×3 IMPLANT
TUBING INSUFFLATION (TUBING) ×3 IMPLANT

## 2014-09-02 NOTE — Anesthesia Procedure Notes (Signed)
Procedure Name: Intubation Date/Time: 09/02/2014 9:35 AM Performed by: Brien MatesMAHONY, Wandy Bossler D Pre-anesthesia Checklist: Patient identified, Emergency Drugs available, Patient being monitored, Suction available and Timeout performed Patient Re-evaluated:Patient Re-evaluated prior to inductionOxygen Delivery Method: Circle system utilized Preoxygenation: Pre-oxygenation with 100% oxygen Intubation Type: IV induction Ventilation: Mask ventilation without difficulty and Oral airway inserted - appropriate to patient size Laryngoscope size: Elective glidescope. Grade View: Grade I Tube type: Oral Tube size: 7.5 mm Airway Equipment and Method: Stylet and Video-laryngoscopy Placement Confirmation: ETT inserted through vocal cords under direct vision,  positive ETCO2 and breath sounds checked- equal and bilateral Secured at: 21 cm Tube secured with: Tape Dental Injury: Teeth and Oropharynx as per pre-operative assessment

## 2014-09-02 NOTE — Anesthesia Postprocedure Evaluation (Signed)
Anesthesia Post Note  Patient: Ronnell GuadalajaraJudith G Anglemyer  Procedure(s) Performed: Procedure(s) (LRB): APPENDECTOMY LAPAROSCOPIC  (N/A)  Anesthesia type: General  Patient location: PACU  Post pain: Pain level controlled and Adequate analgesia  Post assessment: Post-op Vital signs reviewed, Patient's Cardiovascular Status Stable, Respiratory Function Stable, Patent Airway and Pain level controlled  Last Vitals:  Filed Vitals:   09/02/14 1130  BP: 144/64  Pulse: 84  Temp:   Resp: 15    Post vital signs: Reviewed and stable  Level of consciousness: awake, alert  and oriented  Complications: No apparent anesthesia complications

## 2014-09-02 NOTE — Anesthesia Preprocedure Evaluation (Addendum)
Anesthesia Evaluation  Patient identified by MRN, date of birth, ID band Patient awake    Reviewed: Allergy & Precautions, H&P , NPO status , Patient's Chart, lab work & pertinent test results  History of Anesthesia Complications (+) PONV and history of anesthetic complications  Airway Mallampati: II TM Distance: <3 FB Neck ROM: full  Mouth opening: Limited Mouth Opening  Dental  (+) Teeth Intact, Missing, Dental Advisory Given   Pulmonary neg pulmonary ROS,          Cardiovascular negative cardio ROS      Neuro/Psych    GI/Hepatic hiatal hernia,   Endo/Other    Renal/GU      Musculoskeletal   Abdominal   Peds  Hematology   Anesthesia Other Findings Pt has TMJ trouble, no surgery or incident with being unable to close...  Reproductive/Obstetrics                          Anesthesia Physical Anesthesia Plan  ASA: II  Anesthesia Plan: General   Post-op Pain Management:    Induction: Intravenous  Airway Management Planned: Oral ETT  Additional Equipment:   Intra-op Plan:   Post-operative Plan: Extubation in OR  Informed Consent: I have reviewed the patients History and Physical, chart, labs and discussed the procedure including the risks, benefits and alternatives for the proposed anesthesia with the patient or authorized representative who has indicated his/her understanding and acceptance.   Dental advisory given  Plan Discussed with: CRNA, Anesthesiologist and Surgeon  Anesthesia Plan Comments:        Anesthesia Quick Evaluation

## 2014-09-02 NOTE — Transfer of Care (Signed)
Immediate Anesthesia Transfer of Care Note  Patient: Felicia GuadalajaraJudith G Regino  Procedure(s) Performed: Procedure(s): APPENDECTOMY LAPAROSCOPIC  (N/A)  Patient Location: PACU  Anesthesia Type:General  Level of Consciousness: awake  Airway & Oxygen Therapy: Patient Spontanous Breathing and Patient connected to face mask oxygen  Post-op Assessment: Report given to PACU RN and Post -op Vital signs reviewed and stable  Post vital signs: Reviewed and stable  Complications: No apparent anesthesia complications

## 2014-09-02 NOTE — H&P (Signed)
The patient is a 63 year old female who presents with appendicitis. [age] yof admitted to hospital from 8/10-8/13 with perforated appendicitis. She was not ill at that time and was treated with abx. She rapidly improved and was discharged home on abx. She is done with her abx. She is eating well. Having normal bms. She is back to work. She gets tired at end of day. She has some occasional abdominal soreness. No fevers. she has had a colonoscopy that showed diverticulosis, one possible small polyp at [facility] and one small polyp in sigmoid colon . These were both biopsied. appendiceal polyp is inflamed mucosa with intramucosal lymphoid aggregates as is other area. She returns today with some occasional rlq pain but otherwise well   Other Problems [facility]([place], RMA; 08/20/2014 1:32 PM) Cholelithiasis Gastroesophageal Reflux Disease  Past Surgical History ([place], [place]; 08/20/2014 1:32 PM) Colon Polyp Removal - Colonoscopy Gallbladder Surgery - Laparoscopic Knee Surgery Right.  Diagnostic Studies History [facility]([place], [place]; 08/20/2014 1:32 PM) Colonoscopy within last year Mammogram >3 years ago Pap Smear >5 years ago  Allergies [facility]([place], [place]; 08/20/2014 11:20 AM) No Known Drug Allergies10/12/2013  Medication History (Dahionnarah [place], RMA; 08/20/2014 1:32 PM) Ibuprofen (200MG  Capsule, 1 (one) Oral) Active. Medications Reconciled  Social History ([place], [place]; 08/20/2014 1:32 PM) Caffeine use Tea. No alcohol use No drug use Tobacco use Never smoker.  Family History [facility] Grooms([place], [place]; 08/20/2014 1:32 PM) Cervical Cancer Sister. Diabetes Mellitus Father. Melanoma Father. Respiratory Condition Sister.  Pregnancy / Birth History Sinclair Grooms([place]ionnarah [place], [place]; 08/20/2014 1:32 PM) Age at menarche 15 years. Age of menopause 4856-60 Gravida 5 Irregular periods Maternal age 63-30 Para 4  Review of  Systems (Dahionnarah Maldonado RMA; 08/20/2014 1:32 PM) General Not Present- Appetite Loss, Chills, Fatigue, Fever, Night Sweats, Weight Gain and Weight Loss. Skin Not Present- Change in Wart/Mole, Dryness, Hives, Jaundice, New Lesions, Non-Healing Wounds, Rash and Ulcer. HEENT Not Present- Earache, Hearing Loss, Hoarseness, Nose Bleed, Oral Ulcers, Ringing in the Ears, Seasonal Allergies, Sinus Pain, Sore Throat, Visual Disturbances, Wears glasses/contact lenses and Yellow Eyes. Respiratory Not Present- Bloody sputum, Chronic Cough, Difficulty Breathing, Snoring and Wheezing. Cardiovascular Not Present- Chest Pain, Difficulty Breathing Lying Down, Leg Cramps, Palpitations, Rapid Heart Rate, Shortness of Breath and Swelling of Extremities. Gastrointestinal Present- Abdominal Pain. Not Present- Bloating, Bloody Stool, Change in Bowel Habits, Chronic diarrhea, Constipation, Difficulty Swallowing, Excessive gas, Gets full quickly at meals, Hemorrhoids, Indigestion, Nausea, Rectal Pain and Vomiting. Female Genitourinary Not Present- Frequency, Nocturia, Painful Urination, Pelvic Pain and Urgency. Musculoskeletal Not Present- Back Pain, Joint Pain, Joint Stiffness, Muscle Pain, Muscle Weakness and Swelling of Extremities. Neurological Not Present- Decreased Memory, Fainting, Headaches, Numbness, Seizures, Tingling, Tremor, Trouble walking and Weakness. Psychiatric Not Present- Anxiety, Bipolar, Change in Sleep Pattern, Depression, Fearful and Frequent crying. Endocrine Not Present- Cold Intolerance, Excessive Hunger, Hair Changes, Heat Intolerance, Hot flashes and New Diabetes. Hematology Not Present- Easy Bruising, Excessive bleeding, Gland problems, HIV and Persistent Infections.   Vitals (Dahionnarah Maldonado RMA; 08/20/2014 1:35 PM) 08/20/2014 1:34 PM Weight: 178.4 lb Height: 66in Body Surface Area: 1.94 m Body Mass Index: 28.79 kg/m Temp.: 99.70F(Oral)  Pulse: 79 (Regular)  P.OX: 99%  (Room air) BP: 128/80 (Sitting, Left Arm, Standard)    Physical Exam Emelia Loron(Evalene Vath MD; 08/20/2014 4:28 PM) General Mental Status-Alert. Orientation-Oriented X3.  Chest and Lung Exam Chest and lung exam reveals -normal excursion with symmetric chest walls, quiet, even and easy respiratory effort with no use of accessory muscles and  on auscultation, normal breath sounds, no adventitious sounds and normal vocal resonance.  Cardiovascular Cardiovascular examination reveals -normal heart sounds, regular rate and rhythm with no murmurs.  Abdomen Note: healed scars, mild tenderness rlq     Assessment & Plan Emelia Loron(Delvis Kau MD; 08/20/2014 4:29 PM) PERFORATED APPENDICITIS (K35.2) Impression: she is better but still has some soreness in her rlq. colonscopy basically normal. We discussed observation vs surgery. I recommended surgery due to some soreness and she is agreeable. Does not want this to happen again which certainly is possibility. we discussed laparoscopic exploration with appendectomy but discussed risk of open surgery and possible ileocecectomy even. hopefully with some time this will be able to be done laparoscopically. risks were all discussed as was hospital stay and recovery.

## 2014-09-02 NOTE — Discharge Instructions (Signed)
[facility] LAPAROSCOPIC SURGERY: POST OP INSTRUCTIONS  Always review your discharge instruction sheet given to you by the facility where your surgery was performed. IF YOU HAVE DISABILITY OR FAMILY LEAVE FORMS, YOU MUST BRING THEM TO THE OFFICE FOR PROCESSING.   DO NOT GIVE THEM TO YOUR DOCTOR.  1. A prescription for pain medication may be given to you upon discharge.  Take your pain medication as prescribed, if needed.  If narcotic pain medicine is not needed, then you may take acetaminophen (Tylenol), naprosyn (Alleve), or ibuprofen (Advil) as needed. 2. Take your usually prescribed medications unless otherwise directed. 3. If you need a refill on your pain medication, please contact your pharmacy.  They will contact our office to request authorization. Prescriptions will not be filled after 5pm or on week-ends. 4. You should follow a light diet the first few days after arrival home, such as soup and crackers, etc.  Be sure to include lots of fluids daily. 5. Most patients will experience some swelling and bruising in the area of the incisions.  Ice packs will help.  Swelling and bruising can take several days to resolve.  6. It is common to experience some constipation if taking pain medication after surgery.  Increasing fluid intake and taking a stool softener (such as Colace) will usually help or prevent this problem from occurring.  A mild laxative (Milk of Magnesia or Miralax) should be taken according to package instructions if there are no bowel movements after 48 hours. 7. Unless discharge instructions indicate otherwise, you may remove your bandages 48 hours after surgery, and you may shower at that time.  You may have steri-strips (small skin tapes) in place directly over the incision.  These strips should be left on the skin for 7-10 days.  If your surgeon used skin glue on the incision, you may shower in 24 hours.  The glue will flake  off over the next 2-3 weeks.  Any sutures or staples will be removed at the office during your follow-up visit. 8. ACTIVITIES:  You may resume regular (light) daily activities beginning the next day--such as daily self-care, walking, climbing stairs--gradually increasing activities as tolerated.  You may have sexual intercourse when it is comfortable.  Refrain from any heavy lifting or straining until approved by your doctor. a. You may drive when you are no longer taking prescription pain medication, you can comfortably wear a seatbelt, and you can safely maneuver your car and apply brakes. b. RETURN TO WORK:  __________________________________________________________ 9. You should see your doctor in the office for a follow-up appointment approximately 2-3 weeks after your surgery.  Make sure that you call for this appointment within a day or two after you arrive home to insure a convenient appointment time. 10. OTHER INSTRUCTIONS: __________________________________________________________________________________________________________________________ __________________________________________________________________________________________________________________________ WHEN TO CALL YOUR DOCTOR: 1. Fever over 101.0 2. Inability to urinate 3. Continued bleeding from incision. 4. Increased pain, redness, or drainage from the incision. 5. Increasing abdominal pain  The clinic staff is available to answer your questions during regular business hours.  Please don't hesitate to call and ask to speak to one of the nurses for clinical concerns.  If you have a medical emergency, go to the nearest emergency room or call 911.  A surgeon from [facility] is always on call at the hospital. [address], [address], [place], [place]  [address] ? [address] [address], [place], [place]   [address] [phone] ? [phone] ? [phone]   [phone] Web site: [email or web address]  

## 2014-09-02 NOTE — Progress Notes (Signed)
May give an additional 2 mg dilaudid - per Dr Chaney MallingHodierne

## 2014-09-02 NOTE — Op Note (Signed)
Preoperative diagnosis: prior perforated appendicitis Postoperative diagnosis: same as above Procedure: interval laparoscopic appendectomy Surgeon: Dr Harden MoMatt Jaquetta Currier Anesthesia: General EBL: minimal Complications: none Drains: none Specimens: appendix to pathology Sponge and needle count correct Dispo to pacu stable  Indications:This is a 63 year old female who presented a couple of months ago with perforated appendicitis. This was managed with antibiotics alone. She has undergone a colonoscopy since it does not show a lesion. We discussed proceeding with an interval appendectomy for prevention of future episodes as well as some occasional pain that she still has a right lower quadrant.  Procedure: After informed consent was obtained the patient was taken to the operating room. She was given cefazolin. Sequential compression devices were on her legs. She was in placed under general anesthesia without complication. A Foley catheter was placed. Her abdomen was prepped and draped in the standard sterile surgical fashion. A surgical timeout was then performed.  I infiltrated Marcaine below her umbilicus. I made a vertical incision below this. This was done just in case I needed to do an open operation. I identified the fascia and entered it sharply. The peritoneum was entered bluntly. There was no evidence of an entry injury. I then placed a 0 Vicryl pursestring suture through the fascia. I then inserted a Hassan trocar and insufflated the abdomen to 15 mm mercury pressure. I then inserted 2 further 5 mm trocars in the suprapubic region as well as in the left lower quadrant under direct vision without complication. I was able to identify her right colon. Her terminal ileum as well as her right colon were adherent to the side wall. Using combination of the harmonic scalpel as well as scissors I then released the colon from the sidewall. Once I had done this I was able to identify a diminutive appendix it  was very here and the distal bowel. This was removed and the small bowel using the scissors. Once I had done this I was able to trace this to the base of the appendix at the cecum. I then was able to use the stapler to come across the base. I did remove a small portion of the colon with this as well. This was very thickened tissue and I used 2 loads to come across this. This was then placed in a bag and removed. Pathology confirmed that this was the appendix as it was very small and this was done. Then observed the staple line. He was hemostatic. It looked to be in good position and healthy. I then irrigated. I placed the omentum in the right lower quadrant after I inspected the cecum and the small bowel and noted no evidence of any injury. I then removed my Hassan trocar. I tied my pursestring. I then placed 2 additional 0 Vicryl sutures using the Endo Close device to completely obliterate this defect. I then removed the 5 mm trocars and desufflated the abdomen. I then closed all of these with 4-0  Monocryl and Dermabond. She tolerated as well as extubated and transferred to recovery stable.

## 2014-09-03 MED ORDER — OXYCODONE-ACETAMINOPHEN 10-325 MG PO TABS
1.0000 | ORAL_TABLET | Freq: Four times a day (QID) | ORAL | Status: AC | PRN
Start: 2014-09-03 — End: 2015-09-03

## 2014-09-03 NOTE — Progress Notes (Addendum)
Patient decided to discharge, tolerated breakfast and lunch tray, surgical sites still tender when she moves, pain  Medication given before discharge.

## 2014-09-03 NOTE — Discharge Summary (Signed)
  Physician Discharge Summary  Patient ID: Ronnell GuadalajaraJudith G Nguyen MRN: [id number] DOB/AGE: 63/04/1951 63 y.o.  Admit date: 09/02/2014 Discharge date: 09/03/2014  Admitting Diagnosis: Perforated appendicitis   Discharge Diagnosis Patient Active Problem List   Diagnosis Date Noted  . Appendiceal abscess 09/02/2014  . Acute appendicitis with rupture 06/28/2014  . Chronic cholecystitis with calculus 05/25/2013    Consultants none  Imaging: No results found.  Procedures Laparoscopic appendectomy---Dr. Dwain SarnaWakefield 09/02/14  Hospital Course:  Felicia AlbaJudith Nguyen is a 63 year old female hospitalized 8/10-8/13 with perforated appendicitis at which time she was treated with antibiotics.  She rapidly improved and was discharged home with PO antibiotics and follow up with Dr. Dwain SarnaWakefield.  She then had a colonoscopy which showed diverticulosis, appendiceal polyp is inflamed mucosa with intramucosal lymphoid aggregates.  She then returned for an interval appendectomy on the 15th.  The patient tolerated the procedure well and was transferred to the floor.  She remained stable overnight.  Diet was advanced as tolerated.  On POD#1, the patient was voiding well, tolerating diet, ambulating well, pain well controlled, vital signs stable, incisions c/d/i and felt stable for discharge home.  Patient will follow up in our office in 2-3 weeks and knows to call with questions or concerns.      Medication List         ibuprofen 200 MG tablet  Commonly known as:  ADVIL,MOTRIN  Take 200 mg by mouth every 6 (six) hours as needed for mild pain.     oxyCODONE-acetaminophen 10-325 MG per tablet  Commonly known as:  PERCOCET  Take 1 tablet by mouth every 6 (six) hours as needed for pain.             Follow-up Information   [facility] weeks.   Specialty:  General Surgery   Contact information:   [address] [place] [id number] [phone]       Signed: Ashok Norrismina  Aubrie Lucien, [facility] CampusNP-BC [facility] [phone]  09/03/2014, 1:54 PM

## 2014-09-03 NOTE — Progress Notes (Signed)
1 Day Post-Op  Subjective: Sore, tol clears, ambulating to bathroom, no n/v  Objective: Vital signs in last 24 hours: Temp:  [97.4 F (36.3 C)-98.3 F (36.8 C)] 97.5 F (36.4 C) (10/16 0557) Pulse Rate:  [67-95] 69 (10/16 0557) Resp:  [10-22] 18 (10/16 0557) BP: (103-152)/(39-90) 106/39 mmHg (10/16 0557) SpO2:  [93 %-100 %] 98 % (10/16 0557) Weight:  [181 lb (82.101 kg)] 181 lb (82.101 kg) (10/15 1230) Last BM Date: 09/02/14  Intake/Output from previous day: 10/15 0701 - 10/16 0700 In: 3468 [P.O.:1110; I.V.:2358] Out: 1725 [Urine:1700; Blood:25] Intake/Output this shift:    General appearance: no distress Resp: clear to auscultation bilaterally Cardio: regular rate and rhythm GI: soft approp tender, some bs, incisions clean  Lab Results:  No results found for this basename: WBC, HGB, HCT, PLT,  in the last 72 hours BMET No results found for this basename: NA, K, CL, CO2, GLUCOSE, BUN, CREATININE, CALCIUM,  in the last 72 hours PT/INR No results found for this basename: LABPROT, INR,  in the last 72 hours ABG No results found for this basename: PHART, PCO2, PO2, HCO3,  in the last 72 hours  Studies/Results: No results found.  Anti-infectives: Anti-infectives   Start     Dose/Rate Route Frequency Ordered Stop   09/02/14 0600  ceFAZolin (ANCEF) IVPB 2 g/50 mL premix     2 g 100 mL/hr over 30 Minutes Intravenous On call to O.R. 09/01/14 1449 09/02/14 0922      Assessment/Plan: POD 1 lap interval appy  Po pain meds Regular diet If does well possibly home later today  [facility],Beverlyn Mcginness 09/03/2014

## 2014-09-07 ENCOUNTER — Encounter (HOSPITAL_COMMUNITY): Payer: Self-pay | Admitting: General Surgery

## 2016-03-01 IMAGING — CR DG CHEST 1V PORT
1 series · 1 of 1 positions shown · non-contrast
Comparison: None.

CLINICAL DATA: Ruptured appendix, preop.

EXAM:
PORTABLE CHEST - 1 VIEW

[AP]
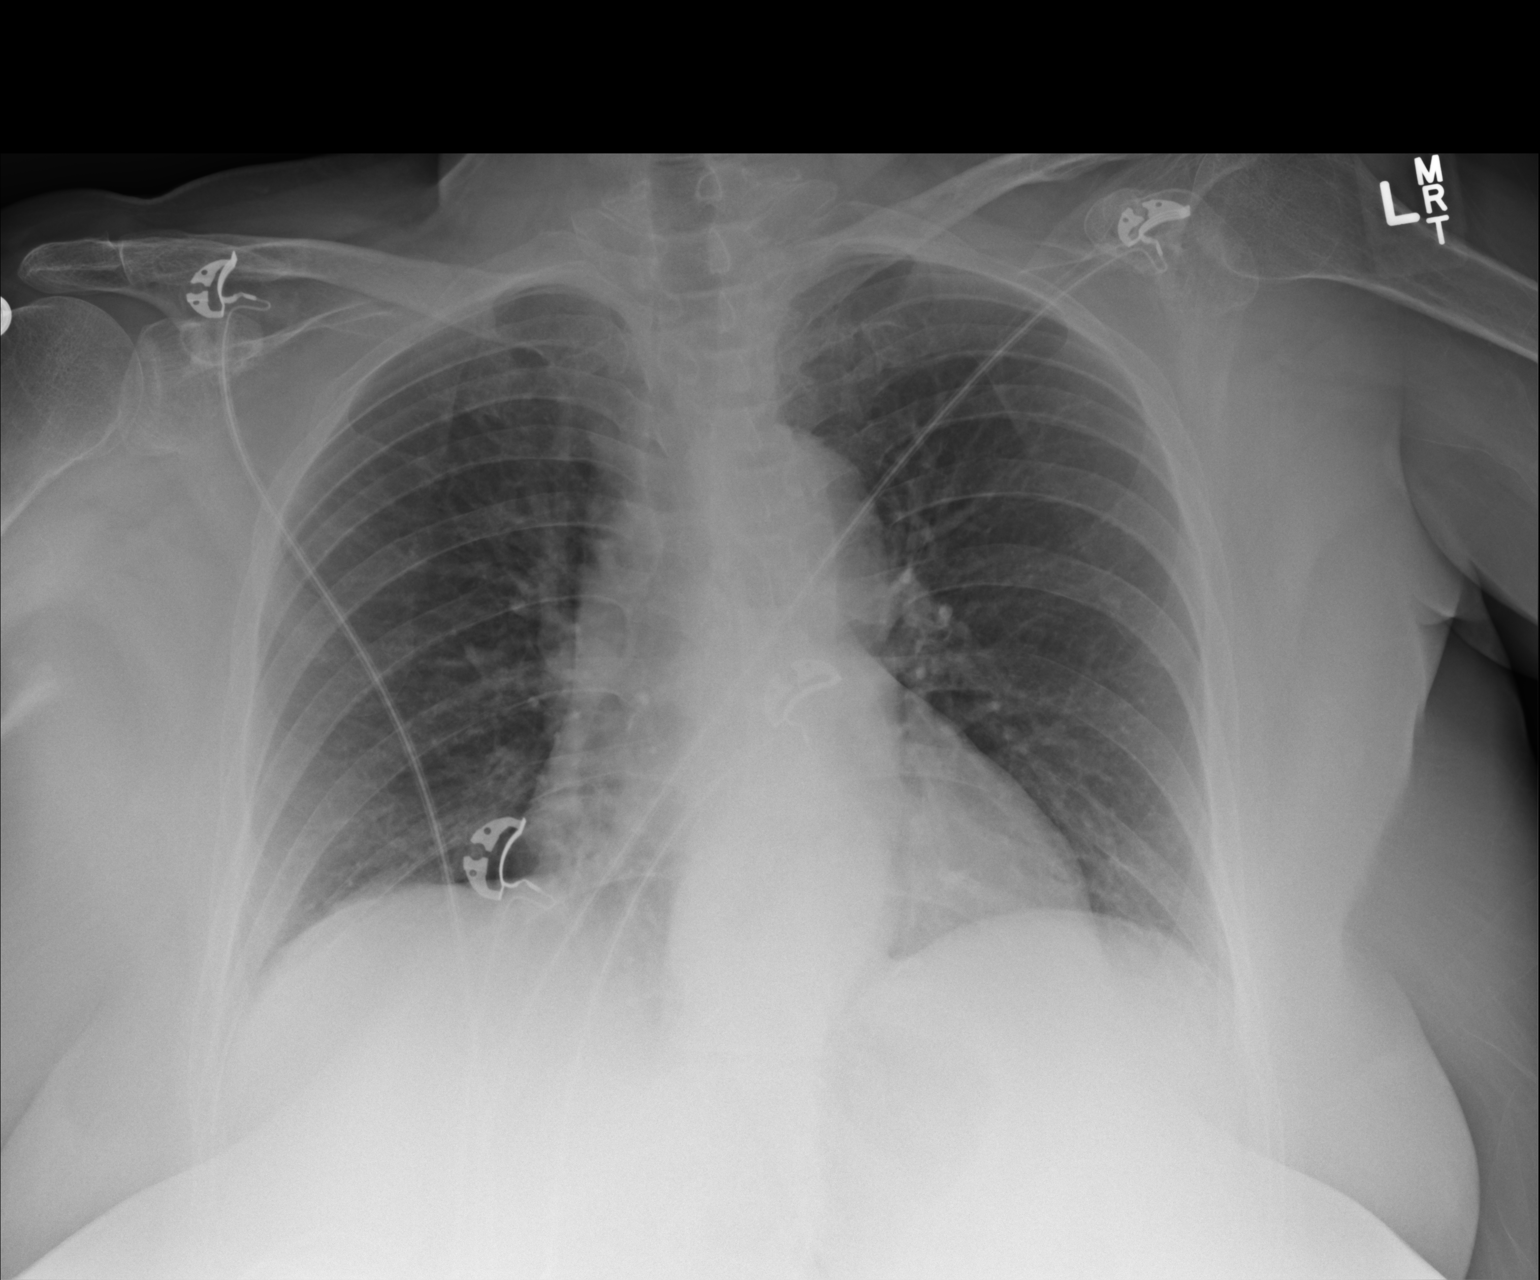

[1 of 1 positions shown; findings below may reference images not displayed]

FINDINGS: Trachea is midline. Heart size is accentuated by AP semi upright
technique and low lung volumes. Lungs are clear. No pleural fluid.
IMPRESSION: No acute findings.
